# Patient Record
Sex: Female | Born: 1950 | Race: White | Hispanic: No | Smoking: Former smoker
Health system: Southern US, Community
[De-identification: ages and names within clinical notes are randomized; demographics above are authoritative.]

## PROBLEM LIST (undated history)

## (undated) DIAGNOSIS — F419 Anxiety disorder, unspecified: Secondary | ICD-10-CM

## (undated) DIAGNOSIS — F32A Depression, unspecified: Secondary | ICD-10-CM

## (undated) DIAGNOSIS — F329 Major depressive disorder, single episode, unspecified: Secondary | ICD-10-CM

## (undated) DIAGNOSIS — J449 Chronic obstructive pulmonary disease, unspecified: Secondary | ICD-10-CM

## (undated) HISTORY — PX: APPENDECTOMY: SHX54

## (undated) HISTORY — PX: STOMACH SURGERY: SHX791

---

## 1998-09-20 ENCOUNTER — Encounter: Payer: Self-pay | Admitting: Family Medicine

## 1998-09-20 ENCOUNTER — Ambulatory Visit (HOSPITAL_COMMUNITY): Admission: RE | Admit: 1998-09-20 | Discharge: 1998-09-20 | Payer: Self-pay | Admitting: Family Medicine

## 1999-10-24 ENCOUNTER — Ambulatory Visit (HOSPITAL_COMMUNITY): Admission: RE | Admit: 1999-10-24 | Discharge: 1999-10-24 | Payer: Self-pay | Admitting: Family Medicine

## 1999-10-24 ENCOUNTER — Encounter: Payer: Self-pay | Admitting: Family Medicine

## 1999-11-15 ENCOUNTER — Emergency Department (HOSPITAL_COMMUNITY): Admission: EM | Admit: 1999-11-15 | Discharge: 1999-11-15 | Payer: Self-pay | Admitting: Emergency Medicine

## 1999-11-15 ENCOUNTER — Encounter: Payer: Self-pay | Admitting: Emergency Medicine

## 2000-12-03 ENCOUNTER — Encounter: Payer: Self-pay | Admitting: Family Medicine

## 2000-12-03 ENCOUNTER — Ambulatory Visit (HOSPITAL_COMMUNITY): Admission: RE | Admit: 2000-12-03 | Discharge: 2000-12-03 | Payer: Self-pay | Admitting: Family Medicine

## 2001-02-08 ENCOUNTER — Observation Stay (HOSPITAL_COMMUNITY): Admission: RE | Admit: 2001-02-08 | Discharge: 2001-02-09 | Payer: Self-pay | Admitting: Obstetrics and Gynecology

## 2001-12-23 ENCOUNTER — Encounter: Payer: Self-pay | Admitting: Family Medicine

## 2001-12-23 ENCOUNTER — Ambulatory Visit (HOSPITAL_COMMUNITY): Admission: RE | Admit: 2001-12-23 | Discharge: 2001-12-23 | Payer: Self-pay | Admitting: Family Medicine

## 2002-02-14 ENCOUNTER — Other Ambulatory Visit: Admission: RE | Admit: 2002-02-14 | Discharge: 2002-02-14 | Payer: Self-pay | Admitting: Obstetrics and Gynecology

## 2002-02-28 ENCOUNTER — Encounter: Payer: Self-pay | Admitting: Obstetrics and Gynecology

## 2002-02-28 ENCOUNTER — Encounter: Admission: RE | Admit: 2002-02-28 | Discharge: 2002-02-28 | Payer: Self-pay | Admitting: Obstetrics and Gynecology

## 2002-11-03 ENCOUNTER — Inpatient Hospital Stay (HOSPITAL_COMMUNITY): Admission: EM | Admit: 2002-11-03 | Discharge: 2002-11-12 | Payer: Self-pay | Admitting: Psychiatry

## 2009-07-19 ENCOUNTER — Emergency Department: Payer: Self-pay | Admitting: Emergency Medicine

## 2009-12-07 ENCOUNTER — Emergency Department: Payer: Self-pay | Admitting: Emergency Medicine

## 2011-01-06 ENCOUNTER — Emergency Department (HOSPITAL_COMMUNITY)
Admission: EM | Admit: 2011-01-06 | Discharge: 2011-01-06 | Disposition: A | Discharge status: Home / Self Care | Payer: Medicare Other | Attending: Emergency Medicine | Admitting: Emergency Medicine

## 2011-01-06 DIAGNOSIS — J449 Chronic obstructive pulmonary disease, unspecified: Secondary | ICD-10-CM | POA: Insufficient documentation

## 2011-01-06 DIAGNOSIS — F3289 Other specified depressive episodes: Secondary | ICD-10-CM | POA: Insufficient documentation

## 2011-01-06 DIAGNOSIS — F1010 Alcohol abuse, uncomplicated: Secondary | ICD-10-CM | POA: Insufficient documentation

## 2011-01-06 DIAGNOSIS — F329 Major depressive disorder, single episode, unspecified: Secondary | ICD-10-CM | POA: Insufficient documentation

## 2011-01-06 DIAGNOSIS — F068 Other specified mental disorders due to known physiological condition: Secondary | ICD-10-CM | POA: Insufficient documentation

## 2011-01-06 DIAGNOSIS — J4489 Other specified chronic obstructive pulmonary disease: Secondary | ICD-10-CM | POA: Insufficient documentation

## 2011-01-06 LAB — CBC
HCT: 40.7 % (ref 36.0–46.0)
Hemoglobin: 13.4 g/dL (ref 12.0–15.0)
MCH: 30.1 pg (ref 26.0–34.0)
MCHC: 32.9 g/dL (ref 30.0–36.0)
MCV: 91.5 fL (ref 78.0–100.0)
Platelets: 241 10*3/uL (ref 150–400)
RBC: 4.45 MIL/uL (ref 3.87–5.11)
RDW: 14.4 % (ref 11.5–15.5)
WBC: 7.1 10*3/uL (ref 4.0–10.5)

## 2011-01-06 LAB — DIFFERENTIAL
Basophils Absolute: 0 10*3/uL (ref 0.0–0.1)
Basophils Relative: 1 % (ref 0–1)
Eosinophils Absolute: 0.1 10*3/uL (ref 0.0–0.7)
Eosinophils Relative: 1 % (ref 0–5)
Lymphocytes Relative: 35 % (ref 12–46)
Lymphs Abs: 2.5 10*3/uL (ref 0.7–4.0)
Monocytes Absolute: 0.7 10*3/uL (ref 0.1–1.0)
Monocytes Relative: 10 % (ref 3–12)
Neutro Abs: 3.8 10*3/uL (ref 1.7–7.7)
Neutrophils Relative %: 54 % (ref 43–77)

## 2011-01-06 LAB — COMPREHENSIVE METABOLIC PANEL
AST: 24 U/L (ref 0–37)
BUN: 7 mg/dL (ref 6–23)
CO2: 27 mEq/L (ref 19–32)
Chloride: 104 mEq/L (ref 96–112)
Creatinine, Ser: 0.74 mg/dL (ref 0.50–1.10)
GFR calc non Af Amer: >60 mL/min (ref >60)
Total Bilirubin: 0.3 mg/dL (ref 0.3–1.2)

## 2011-01-06 LAB — RAPID URINE DRUG SCREEN, HOSP PERFORMED
Cocaine: NOT DETECTED
Opiates: NOT DETECTED

## 2011-01-06 LAB — ETHANOL: Alcohol, Ethyl (B): 93 mg/dL — ABNORMAL HIGH (ref 0–11)

## 2014-01-04 ENCOUNTER — Observation Stay (HOSPITAL_COMMUNITY)
Admission: AD | Admit: 2014-01-04 | Discharge: 2014-01-05 | Disposition: A | Discharge status: Home / Self Care | Payer: Medicare Other | Source: Intra-hospital | Attending: Psychiatry | Admitting: Psychiatry

## 2014-01-04 ENCOUNTER — Encounter (HOSPITAL_COMMUNITY): Payer: Self-pay | Admitting: Emergency Medicine

## 2014-01-04 ENCOUNTER — Emergency Department (HOSPITAL_COMMUNITY)
Admission: EM | Admit: 2014-01-04 | Discharge: 2014-01-04 | Disposition: A | Discharge status: Other Acute Inpatient Hospital | Payer: Medicare Other | Source: Home / Self Care

## 2014-01-04 DIAGNOSIS — F411 Generalized anxiety disorder: Secondary | ICD-10-CM | POA: Insufficient documentation

## 2014-01-04 DIAGNOSIS — F17200 Nicotine dependence, unspecified, uncomplicated: Secondary | ICD-10-CM | POA: Insufficient documentation

## 2014-01-04 DIAGNOSIS — F39 Unspecified mood [affective] disorder: Secondary | ICD-10-CM

## 2014-01-04 DIAGNOSIS — F329 Major depressive disorder, single episode, unspecified: Secondary | ICD-10-CM | POA: Diagnosis present

## 2014-01-04 DIAGNOSIS — F332 Major depressive disorder, recurrent severe without psychotic features: Principal | ICD-10-CM | POA: Insufficient documentation

## 2014-01-04 DIAGNOSIS — F32A Depression, unspecified: Secondary | ICD-10-CM | POA: Diagnosis present

## 2014-01-04 DIAGNOSIS — F3289 Other specified depressive episodes: Secondary | ICD-10-CM | POA: Insufficient documentation

## 2014-01-04 DIAGNOSIS — R45851 Suicidal ideations: Secondary | ICD-10-CM | POA: Insufficient documentation

## 2014-01-04 HISTORY — DX: Major depressive disorder, single episode, unspecified: F32.9

## 2014-01-04 HISTORY — DX: Anxiety disorder, unspecified: F41.9

## 2014-01-04 HISTORY — DX: Depression, unspecified: F32.A

## 2014-01-04 LAB — RAPID URINE DRUG SCREEN, HOSP PERFORMED
AMPHETAMINES: NOT DETECTED
BARBITURATES: NOT DETECTED
Benzodiazepines: NOT DETECTED
Cocaine: NOT DETECTED
Opiates: NOT DETECTED
TETRAHYDROCANNABINOL: NOT DETECTED

## 2014-01-04 LAB — CBC
HCT: 42.1 % (ref 36.0–46.0)
HEMOGLOBIN: 14.3 g/dL (ref 12.0–15.0)
MCH: 30.6 pg (ref 26.0–34.0)
MCHC: 34 g/dL (ref 30.0–36.0)
MCV: 90.1 fL (ref 78.0–100.0)
PLATELETS: 232 10*3/uL (ref 150–400)
RBC: 4.67 MIL/uL (ref 3.87–5.11)
RDW: 13.9 % (ref 11.5–15.5)
WBC: 6.2 10*3/uL (ref 4.0–10.5)

## 2014-01-04 LAB — ETHANOL: Alcohol, Ethyl (B): <11 mg/dL (ref 0–11)

## 2014-01-04 LAB — COMPREHENSIVE METABOLIC PANEL
ALT: 12 U/L (ref 0–35)
AST: 22 U/L (ref 0–37)
Albumin: 3.7 g/dL (ref 3.5–5.2)
Alkaline Phosphatase: 99 U/L (ref 39–117)
BILIRUBIN TOTAL: 0.3 mg/dL (ref 0.3–1.2)
BUN: 12 mg/dL (ref 6–23)
CHLORIDE: 103 meq/L (ref 96–112)
CO2: 30 meq/L (ref 19–32)
Calcium: 9.5 mg/dL (ref 8.4–10.5)
Creatinine, Ser: 0.93 mg/dL (ref 0.50–1.10)
GFR, EST AFRICAN AMERICAN: 76 mL/min — AB (ref >90)
GFR, EST NON AFRICAN AMERICAN: 65 mL/min — AB (ref >90)
GLUCOSE: 97 mg/dL (ref 70–99)
Potassium: 5.3 mEq/L (ref 3.7–5.3)
SODIUM: 142 meq/L (ref 137–147)
Total Protein: 7.4 g/dL (ref 6.0–8.3)

## 2014-01-04 LAB — ACETAMINOPHEN LEVEL: Acetaminophen (Tylenol), Serum: <15 ug/mL (ref 10–30)

## 2014-01-04 LAB — SALICYLATE LEVEL: Salicylate Lvl: <2 mg/dL — ABNORMAL LOW (ref 2.8–20.0)

## 2014-01-04 MED ORDER — MAGNESIUM HYDROXIDE 400 MG/5ML PO SUSP: 30.0000 mL | Freq: Every day | ORAL | Status: DC | PRN

## 2014-01-04 MED ORDER — ACETAMINOPHEN 325 MG PO TABS: 650.0000 mg | ORAL_TABLET | Freq: Four times a day (QID) | ORAL | Status: DC | PRN

## 2014-01-04 MED ORDER — CLONAZEPAM 0.5 MG PO TABS
0.5000 mg | ORAL_TABLET | Freq: Three times a day (TID) | ORAL | Status: DC | PRN
  Administered 2014-01-04: 0.5 mg via ORAL
  Filled 2014-01-04: qty 1

## 2014-01-04 MED ORDER — CITALOPRAM HYDROBROMIDE 20 MG PO TABS
20.0000 mg | ORAL_TABLET | Freq: Every day | ORAL | Status: DC
  Administered 2014-01-05: 20 mg via ORAL
  Filled 2014-01-04 (×3): qty 1

## 2014-01-04 MED ORDER — ALUM & MAG HYDROXIDE-SIMETH 200-200-20 MG/5ML PO SUSP: 30.0000 mL | ORAL | Status: DC | PRN

## 2014-01-04 MED ORDER — CITALOPRAM HYDROBROMIDE 20 MG PO TABS
20.0000 mg | ORAL_TABLET | Freq: Every day | ORAL | Status: DC
  Administered 2014-01-04: 20 mg via ORAL
  Filled 2014-01-04: qty 1

## 2014-01-04 NOTE — Progress Notes (Deleted)
[facility] INPATIENT:  Family/Significant Other Suicide Prevention Education  Suicide Prevention Education:  Patient Refusal for Family/Significant Other Suicide Prevention Education: The patient Gwendolyn Steele has refused to provide written consent for family/significant other to be provided Family/Significant Other Suicide Prevention Education during admission and/or prior to discharge.  Physician notified.  Gwendolyn Steele, Zaineb Nowaczyk [facility] 01/04/2014, 10:11 PM

## 2014-01-04 NOTE — Consult Note (Signed)
  Patient states that she has a history of depression and a previous history of suicide attempt.  Patient states that 4 days ago she was having suicidal thought; denies at this time.  Patient had no outpatient psych provider.    Agree with the TTS assessment' Recommend Observation overnight and setting patient up with outpatient services prior to discharge.  [facility].  Monitor safety and stabilization until transferred.  Home medication started.   Shuvon B. Rankin FNP-BC  Reviewed the information documented and agree with the treatment plan.  Lanee Chain,JANARDHAHA R. 01/05/2014 4:18 PM

## 2014-01-04 NOTE — BH Assessment (Signed)
Tele Assessment Note   Gwendolyn Steele is an 63 y.o. female that was assessed by CSW on this date via face to face after [facility]  Pt states that she's been depressed for some time now and having suicidal ideation last Wednesday triggered her to come to the ED for help.  Pt explained that [place] is retired and from [place] comes to stay with family in [place], [place] for 4 months of the year, every year.  Pt plans to return to [place] in September unless she receives better mental health services here.  Pt states that resources are limited in [place] so she's been seeing [place]CP in [place] for the last 7 years for medication management and has been prescribed celexa and klonopin for the last 7 years by PCP.  Pt states that she feels these meds no longer work for her as she has been depressed and anxious lately.  Pt describes her symptoms as "living in a black hole".  Pt states that she feels worthless, hopeless and suicidal, with last thoughts being last Wednesday.  Pt states that she had a thought to run her car into a tree, which scared her and made her come and get help.  Pt states that she isolates her room and her family leaves her alone.  Pt reports increased sleep, with maybe only being awake 10 hours over the last week and sleeping 22 hours on average a day. Pt reports impulsivity at times as well.  Pt denies SI, HI, AVH currently and is requesting a referral for immediate medication management and therapy.   Pt reports a suicide attempt 7 years ago by overdose on tylenol and was expected not to live.  Pt reports being inpatient after this incident and also 10 [facility], for [facility].  Pt denies any consistent outpatient treatment due to limited resources in her home town.  Pt denies any current triggers or stressors.  Pt reports a history of cutting 3 years ago.  Pt states that her family is supportive.  Pt states that she feels safe and can contract for safety at this  time, but only if she has immediate help for medications and therapy.  CSW discussed the observation unit and IOP as options with pt.  Pt states that she is okay with either.  CSW ran pt by Assunta FoundShuvon Rankin, NP who agreed with this recommendation as well as overnight observation and reassess in the morning.     Axis I: Mood Disorder NOS Axis II: Deferred Axis III:  Past Medical History  Diagnosis Date  . Depression   . Anxiety    Axis IV: other psychosocial or environmental problems and problems with access to health care services Axis V: 51-60 moderate symptoms  Past Medical History:  Past Medical History  Diagnosis Date  . Depression   . Anxiety     Past Surgical History  Procedure Laterality Date  . Appendectomy    . Stomach surgery      tummy tuck  . Stomach surgery      Family History: History reviewed. No pertinent family history.  Social History:  reports that she has been smoking Cigarettes.  She has a 45 pack-year smoking history. She does not have any smokeless tobacco history on file. She reports that she does not drink alcohol or use illicit drugs.  Additional Social History:  Alcohol / Drug Use Pain Medications: See MAR Prescriptions: See MAR [facility] the [facility]  History of alcohol / drug use?: No history of alcohol / drug abuse Longest period of sobriety (when/how long): N/A  CIWA: CIWA-Ar BP: 132/85 mmHg Pulse Rate: 79 COWS:    Allergies:  Allergies  Allergen Reactions  . Abilify [Aripiprazole] Other (See Comments)    Increased suicidal ideations "made her feel worse"    Home Medications:  (Not in a hospital admission)  OB/GYN Status:  No LMP recorded.  General Assessment Data [facility] of Assessment: [facility] ACT Assessment: Yes Is this a Tele or Face-to-Face Assessment?: Face-to-Face Is this an Initial Assessment or a Re-assessment for this encounter?: Initial Assessment Living Arrangements: Other relatives Can pt return to current living  arrangement?: Yes Admission Status: Voluntary Is patient capable of signing voluntary admission?: Yes Transfer from: Home Referral Source: Self/Family/Friend  Medical Screening Exam [facility] Walk-in ONLY) Medical Exam completed: No (N/A) Reason for MSE not completed: Other: (N/A)  [facility] Crisis Care Plan Living Arrangements: Other relatives Name of Psychiatrist:  (None) Name of Therapist:  (None)  Education Status Is patient currently in school?: No Current Grade:  (N/A) Highest grade of school patient has completed:  (11th grade, received GED) Name of school:  (N/A) Contact person:  (N/A)  Risk to self Suicidal Ideation: No-Not Currently/Within Last 6 Months Suicidal Intent: No-Not Currently/Within Last 6 Months Is patient at risk for suicide?: No Suicidal Plan?: Yes-Currently Present Specify Current Suicidal Plan:  (run car into a tree) Access to Means: Yes Specify Access to Suicidal Means:  (has own car) What has been your use of drugs/alcohol within the last 12 months?:  (None reported) Previous Attempts/Gestures: Yes (overdosed 7 years ago) How many times?:  (1) Other Self Harm Risks:  (unknown) Triggers for Past Attempts: Unpredictable;Unknown Intentional Self Injurious Behavior: Cutting (cut 3 years ago) Family Suicide History: Unknown Recent stressful life event(s): Other (Comment) (None) Persecutory voices/beliefs?: No Depression: Yes Depression Symptoms: Tearfulness;Isolating;Fatigue;Guilt;Loss of interest in usual pleasures;Feeling worthless/self pity Substance abuse history and/or treatment for substance abuse?: No Suicide prevention information given to non-admitted patients: Not applicable  Risk to Others Homicidal Ideation: No Thoughts of Harm to Others: No Current Homicidal Intent: No Current Homicidal Plan: No Access to Homicidal Means: No Identified Victim:  (N/A) History of harm to others?: No Assessment of Violence: None Noted Violent Behavior  Description:  (None noted) Does patient have access to weapons?: No Criminal Charges Pending?: No Does patient have a court date: No  Psychosis Hallucinations: None noted Delusions: None noted  Mental Status Report Appear/Hygiene: Unremarkable Eye Contact: Good Motor Activity: Unremarkable Speech: Logical/coherent;Pressured Level of Consciousness: Alert Mood: Worthless, low self-esteem;Helpless Affect: Inconsistent with thought content Anxiety Level: Minimal Thought Processes: Coherent;Relevant Judgement: Unimpaired Orientation: Place;Person;Time;Situation;Appropriate for developmental age Obsessive Compulsive Thoughts/Behaviors: None  Cognitive Functioning Concentration: Normal Memory: Recent Intact;Remote Intact IQ: Average Insight: Good Impulse Control: Fair Appetite: Fair Weight Loss:  (none reported) Weight Gain:  (none reported) Sleep: Increased Total Hours of Sleep:  (22 hours yesterday) Vegetative Symptoms: Staying in bed  ADLScreening [facility] Assessment Services) Patient's cognitive ability adequate to safely complete daily activities?: Yes Patient able to express need for assistance with ADLs?: Yes Independently performs ADLs?: Yes (appropriate for developmental age)  Prior Inpatient Therapy Prior Inpatient Therapy: Yes Prior Therapy Dates:  (10 and 7 years ago) Prior Therapy Facilty/Provider(s):  [facility], inpt in [place] 7 years ago) Reason for Treatment:  (depression, SI, suicide attempt)  Prior Outpatient Therapy Prior Outpatient Therapy: No Prior Therapy Dates:  (N/A) Prior Therapy Facilty/Provider(s):  (  N/A) Reason for Treatment:  (N/A)  ADL Screening (condition at time of admission) Patient's cognitive ability adequate to safely complete daily activities?: Yes Is the patient deaf or have difficulty hearing?: No Does the patient have difficulty seeing, even when wearing glasses/contacts?: No Does the patient have difficulty concentrating,  remembering, or making decisions?: Yes Patient able to express need for assistance with ADLs?: Yes Independently performs ADLs?: Yes (appropriate for developmental age) Does the patient have difficulty walking or climbing stairs?: No Weakness of Legs: None Weakness of Arms/Hands: None  Home Assistive Devices/Equipment Home Assistive Devices/Equipment: None  Therapy Consults (therapy consults require a physician order) PT Evaluation Needed: No OT Evalulation Needed: No SLP Evaluation Needed: No Abuse/Neglect Assessment (Assessment to be complete while patient is alone) Physical Abuse: Denies Verbal Abuse: Denies Sexual Abuse: Denies Exploitation of patient/patient's resources: Denies Self-Neglect: Denies Values / Beliefs Cultural Requests During Hospitalization: None Spiritual Requests During Hospitalization: None Consults Spiritual Care Consult Needed: No Social Work Consult Needed: No Merchant navy officerAdvance Directives (For Healthcare) Advance Directive: Patient does not have advance directive Pre-existing out of facility DNR order (yellow form or pink MOST form): No Nutrition Screen- [facility] Adult/[facility] Patient's home diet: Regular  Additional Information 1:1 In Past 12 Months?: No CIRT Risk: No Elopement Risk: No Does patient have medical clearance?: Yes  Child/Adolescent Assessment Running Away Risk: Denies Bed-Wetting: Denies Destruction of Property: Denies Cruelty to Animals: Denies Stealing: Denies Rebellious/Defies Authority: Denies Satanic Involvement: Denies Archivistire Setting: Denies Problems at [employer]: Denies Gang Involvement: Denies  Disposition:  Disposition Initial Assessment Completed for this Encounter: Yes Disposition of Patient: Other dispositions Other disposition(s): Other (Comment) (either observation unit or IOP)  Horton, Salome ArntChelsea Nicole 01/04/2014 2:51 PM

## 2014-01-04 NOTE — Plan of Care (Deleted)
[facility] Observation Crisis Plan  Reason for Crisis Plan:  Crisis Stabilization   Plan of Care:  Referral for Inpatient Hospitalization  Family Support:    Gwendolyn SjogrenDavid Steele [phone]  Current Living Environment:   [place]ent in the woods.   Insurance:   Hospital Account   Name Acct ID Class Status Primary Coverage   Gwendolyn Steele, Gwendolyn Steele [id number] BEHAVIORAL HEALTH OBSERVATION Open MEDICARE - MEDICARE PART A AND B        Guarantor Account (for Hospital Account [id number])   Name Relation to Pt Service Area Active? Acct Type   Gwendolyn Steele, G[facility]olyn Steele [facility] Yes Behavioral Health   Address Phone       [address] [place], [place] [phone])          Coverage Information (for Hospital Account [id number])   F/O Payor/Plan Precert #   MEDICARE/MEDICARE PART A AND B    Subscriber Subscriber #   Gwendolyn Steele, Gwendolyn Steele [id number] A   Address Phone   [address] [place], [place] [phone]       Legal Guardian:   Self  Primary Care Provider:  No primary provider on file.  Current Outpatient Providers:  ACT  Psychiatrist:   Dr Odelia Steele  Counselor/Therapist:   Josie DixonNorma Steele   Compliant with Medications:  Yes  Additional Information:   Gwendolyn Steele, Gwendolyn Steele 6/14/201510:08 PM

## 2014-01-04 NOTE — ED Provider Notes (Signed)
CSN: [id number]     Arrival date & time 01/04/14  1222 History  This chart was scribed for non-physician practitioner, Teressa LowerVrinda Nyjae Hodge, NP working with Lyanne CoKevin M Campos, MD by Greggory StallionKayla Andersen, ED scribe. This patient was seen in room [facility]TR3/[facility] and the patient's care was started at 12:51 PM.   Chief Complaint  Patient presents with  . Medical Clearance   The history is provided by the patient. No language interpreter was used.   HPI Comments: Gwendolyn Steele is a 63 y.o. female with a history of anxiety and depression who presents to the Emergency Department for medical clearance. She states her anxiety and depression have increased over the past year. States she sleeps constantly because of the depression. Pt does not have a therapist or psychiatrist and only sees her PCP. She is currently taking clonazepam and citalopram daily. Pt tried to commit suicide 7 years ago by taking several Tylenol PM pills. She had suicidal ideations 4 days ago and states she thought about running her car into a tree but denies current SI. Denies HI. Pt has history of alcohol abuse for about 6 months two years ago. Denies history of drug abuse.   Past Medical History  Diagnosis Date  . Depression    Past Surgical History  Procedure Laterality Date  . Appendectomy    . Stomach surgery      tummy tuck  . Stomach surgery     History reviewed. No pertinent family history. History  Substance Use Topics  . Smoking status: Current Every Day Smoker -- 1.00 packs/day for 45 years    Types: Cigarettes  . Smokeless tobacco: Not on file  . Alcohol Use: No     Comment: sober 2 years   OB History   Grav Para Term Preterm Abortions TAB SAB Ect Mult Living                 Review of Systems  Psychiatric/Behavioral: Positive for dysphoric mood. Negative for suicidal ideas. The patient is nervous/anxious.   All other systems reviewed and are negative.  Allergies  Abilify  Home Medications   Prior to Admission  medications   Not on File   BP 132/85  Pulse 79  Temp(Src) 98.4 F (36.9 C) (Oral)  Resp 16  Wt 193 lb (87.544 kg)  SpO2 96%  Physical Exam  Nursing note and vitals reviewed. Constitutional: She is oriented to person, place, and time. She appears well-developed and well-nourished. No distress.  HENT:  Head: Normocephalic and atraumatic.  Eyes: Conjunctivae and EOM are normal.  Neck: Normal range of motion. No tracheal deviation present.  Cardiovascular: Normal rate, regular rhythm and normal heart sounds.   Pulmonary/Chest: Effort normal and breath sounds normal. No respiratory distress. She has no wheezes. She has no rales.  Musculoskeletal: Normal range of motion.  Neurological: She is alert and oriented to person, place, and time.  Skin: Skin is warm and dry.  Psychiatric: She has a normal mood and affect. Her behavior is normal. She expresses no homicidal and no suicidal ideation. She expresses no suicidal plans and no homicidal plans.  Previous suicide attempt with overdose    ED Course  Procedures (including critical care time)  DIAGNOSTIC STUDIES: Oxygen Saturation is 96% on RA, normal by my interpretation.    COORDINATION OF CARE: 12:58 PM-Discussed treatment plan which includes lab work and speaking with a psychiatrist with pt at bedside and pt agreed to plan.   Labs Review Labs Reviewed  COMPREHENSIVE METABOLIC PANEL - Abnormal; Notable for the following:    GFR calc non Af Amer 65 (*)    GFR calc Af Amer 76 (*)    All other components within normal limits  SALICYLATE LEVEL - Abnormal; Notable for the following:    Salicylate Lvl <2.0 (*)    All other components within normal limits  ACETAMINOPHEN LEVEL  CBC  ETHANOL  URINE RAPID DRUG SCREEN (HOSP PERFORMED)    Imaging Review No results found.   EKG Interpretation None      MDM   Final diagnoses:  Depressive disorder    Pt to be evaluated by tts and possibly placed. Pt is not actively  suicidal  I personally performed the services described in this documentation, which was scribed in my presence. The recorded information has been reviewed and is accurate.  Teressa LowerVrinda Mandy Peeks, NP 01/04/14 2008

## 2014-01-04 NOTE — ED Notes (Signed)
Pt reports hx of depression, possible bipolar. Has not seen a psychiatrist in 8 years. Goes to her pcp for mediations. Pt reports she wants to "not feel like she is living in a black hole", at present denies SI. Reports on Thursday pt has SI thoughts of running her car into a tree. Pt denies HI, AH/VH.  Pt denies pain.

## 2014-01-04 NOTE — Progress Notes (Signed)
[facility] INPATIENT:  Family/Significant Other Suicide Prevention Education  Suicide Prevention Education:  Patient Refusal for Family/Significant Other Suicide Prevention Education: The patient Gwendolyn Steele has refused to provide written consent for family/significant other to be provided Family/Significant Other Suicide Prevention Education during admission and/or prior to discharge.  Physician notified.  Glenice LaineIbekwe, Garvis Downum B 01/04/2014, 11:36 PM

## 2014-01-04 NOTE — ED Notes (Signed)
She is very pleasant and cooperative.  She tells me "I am voluntary and I am haing issues with depression."  She was seen by the N.P. Earlier, who told me that she will have a slot in the "holding area" of [facility]. At 1930 hours.

## 2014-01-04 NOTE — Plan of Care (Signed)
[facility] Observation Crisis Plan  Reason for Crisis Plan:  Crisis Stabilization   Plan of Care:  Referral for Telepsychiatry/Psychiatric Consult  Family Support:    SISTER [phone]  Current Living Environment:  PATIENT'S HOME   Insurance:   Hospital Account   Name Acct ID Class Status Primary Coverage   Gwendolyn Steele, Gwendolyn Steele [id number] BEHAVIORAL HEALTH OBSERVATION Open MEDICARE - MEDICARE PART A AND Steele        Guarantor Account (for Hospital Account [id number])   Name Relation to Pt Service Area Active? Acct Type   Gwendolyn Steele, Gwendolyn Steele Self [facility] Yes Behavioral Health   Address Phone       [address] [place], [place] [phone](H)          Coverage Information (for Hospital Account [id number])   F/O Payor/Plan Precert #   MEDICARE/MEDICARE PART A AND Steele    Subscriber Subscriber #   Gwendolyn Steele, Gwendolyn Steele [id number] A   Address Phone   [address] [place], [place] [phone]       Legal Guardian:   NONE  Primary Care Provider:  PT DID NOT PROVIDE NAME  Current Outpatient Providers:  NONE    Psychiatrist:   NONE  Counselor/Therapist:   NONE  Compliant with Medications:  Yes  Additional Information:   Gwendolyn Steele, Gwendolyn Steele 6/14/201511:39 PM

## 2014-01-05 ENCOUNTER — Encounter (HOSPITAL_COMMUNITY): Payer: Self-pay | Admitting: *Deleted

## 2014-01-05 DIAGNOSIS — F332 Major depressive disorder, recurrent severe without psychotic features: Secondary | ICD-10-CM

## 2014-01-05 MED ORDER — CITALOPRAM HYDROBROMIDE 20 MG PO TABS: 20.0000 mg | ORAL_TABLET | Freq: Every day | ORAL | Status: AC

## 2014-01-05 MED ORDER — CLONAZEPAM 1 MG PO TABS: 1.0000 mg | ORAL_TABLET | Freq: Three times a day (TID) | ORAL | Status: DC

## 2014-01-05 NOTE — Discharge Instructions (Signed)
For your current behavioral health needs you have a scheduled start date for the Mental Health Intensive Outpatient Program (MH-IOP) at [facility] in [place].  The program meets Monday - Friday from 9:00 am - 12:00 pm.  Your start date is Tuesday, 01/13/2014.  On the first day, plan to be at the Clinic at 8:45 am.  Lenox AhrFill out the intake paperwork provided by the Observation Unit staff, and take it with you to the first session.       Mental Health Intensive Outpatient Program (MH-IOP)      [facility]      [address]      [place], [place] [address]      Contact person: Jeri ModenaRita Clark      [phone]

## 2014-01-05 NOTE — Progress Notes (Signed)
D  Patient reports no medication changes in years. Increased anxiety. Patient reports taking Klonopin for years. Patient requesting medication changes and outpatient treatment. A  Offered support, encouragement and 15 minute checks.  Medications given as ordered. R  Pt denies SI HI.  Safety maintained on observation unit.

## 2014-01-05 NOTE — H&P (Signed)
[place] OBS H&P  Patient Identification:  Gwendolyn Steele Date of Evaluation:  01/05/2014 Chief Complaint:  MOOD DISORDER NOS  Subjective: Pt seen and chart reviewed. Pt denies SI, HI, and AVH, contracts for safety. Pt reports SI that has resolved 48 hours ago. At this point in time, pt desires intensive outpatient therapy and is a candidate for IOP at [facility]. TTS staff to coordinate this option and refer if this is unavailable.   HPI:  Gwendolyn Steele is an 63 y.o. female that was assessed by CSW on this date via face to face after presenting to [facility] voluntarily. Pt states that she's been depressed for some time now and having suicidal ideation last Wednesday triggered her to come to the ED for help. Pt explained that she is retired and from [place] but comes to stay with family in [place], [place] for 4 months of the year, every year. [place] to [place] in [place] unless she receives better mental health services here. Pt states that resources are limited in [place] so she's been seeing [place] PCP in [place] for the last 7 years for medication management and has been prescribed celexa and klonopin for the last 7 years by PCP. Pt states that she feels these meds no longer work for her as she has been depressed and anxious lately. Pt describes her symptoms as "living in a black hole". Pt states that she feels worthless, hopeless and suicidal, with last thoughts being last Wednesday. Pt states that she had a thought to run her car into a tree, which scared her and made her come and get help. Pt states that she isolates her room and her family leaves her alone. Pt reports increased sleep, with maybe only being awake 10 hours over the last week and sleeping 22 hours on average a day. Pt reports impulsivity at times as well. Pt denies SI, HI, AVH currently and is requesting a referral for immediate medication management and therapy.  Pt reports a suicide attempt 7 years ago by overdose on tylenol and was expected not to  live. Pt reports being inpatient after this incident and also 10 years ago at [facility], for depression. Pt denies any consistent outpatient treatment due to limited resources in her home town. Pt denies any current triggers or stressors. Pt reports a history of cutting 3 years ago. Pt states that her family is supportive. Pt states that she feels safe and can contract for safety at this time, but only if she has immediate help for medications and therapy. CSW discussed the observation unit and IOP as options with pt. Pt states that she is okay with either. CSW ran pt by Earleen Newport, NP who agreed with this recommendation as well as overnight observation and reassess in the morning.    Total Time spent with patient: 40 minutes (initial)   Psychiatric Specialty Exam: Physical Exam Full Physical Exam performed in ED; reviewed, stable, and I concur with this assessment.   Review of Systems  Constitutional: Negative.   HENT: Negative.   Eyes: Negative.   Respiratory: Negative.   Cardiovascular: Negative.   Gastrointestinal: Negative.   Genitourinary: Negative.   Musculoskeletal: Negative.   Skin: Negative.   Neurological: Negative.   Endo/Heme/Allergies: Negative.   Psychiatric/Behavioral: Positive for depression. The patient is nervous/anxious and has insomnia.     Blood pressure 134/91, pulse 89, temperature 97.6 F (36.4 C), temperature source Oral, resp. rate 16, height $RemoveBe'5\' 5"'WBtBBFhRl$  (1.651 m), weight 87.998 kg (  194 lb).Body mass index is 32.28 kg/(m^2).  General Appearance: Casual  Eye Contact::  Good  Speech:  Clear and Coherent  Volume:  Normal  Mood:  Anxious and Depressed  Affect:  Appropriate and Depressed  Thought Process:  Coherent and Goal Directed  Orientation:  Full (Time, Place, and Person)  Thought Content:  WDL  Suicidal Thoughts:  No  Homicidal Thoughts:  No  Memory:  Immediate;   Fair Recent;   Fair Remote;   Fair  Judgement:  Fair  Insight:  Fair  Psychomotor  Activity:  Normal  Concentration:  Fair  Recall:  [employer] of Knowledge:Fair  Language: NA  Akathisia:  No  Handed:    AIMS (if indicated):     Assets:  Communication Skills Desire for Improvement Resilience  Sleep:       Musculoskeletal: Strength & Muscle Tone: within normal limits Gait & Station: normal Patient leans: N/A   Past Medical History:   Past Medical History  Diagnosis Date  . Depression   . Anxiety    None. Allergies:   Allergies  Allergen Reactions  . Abilify [Aripiprazole] Other (See Comments)    Increased suicidal ideations "made her feel worse"   PTA Medications: Prescriptions prior to admission  Medication Sig Dispense Refill  . citalopram (CELEXA) 20 MG tablet Take 20 mg by mouth daily.       . clonazePAM (KLONOPIN) 1 MG tablet Take 1 mg by mouth 3 (three) times daily. She takes 3 a day, sometimes a fourth one as needed.        Previous Psychotropic Medications:    Medication/Dose  SEE MAR               Substance Abuse History in the last 12 months:  no  Consequences of Substance Abuse: NA  Social History:  reports that she has been smoking Cigarettes.  She has a 45 pack-year smoking history. She does not have any smokeless tobacco history on file. She reports that she does not drink alcohol or use illicit drugs. Additional Social History:                      Family History:  No family history on file.  Results for orders placed during the hospital encounter of 01/04/14 (from the past 72 hour(s))  URINE RAPID DRUG SCREEN (HOSP PERFORMED)     Status: None   Collection Time    01/04/14 12:53 PM      Result Value Ref Range   Opiates NONE DETECTED  NONE DETECTED   Cocaine NONE DETECTED  NONE DETECTED   Benzodiazepines NONE DETECTED  NONE DETECTED   Amphetamines NONE DETECTED  NONE DETECTED   Tetrahydrocannabinol NONE DETECTED  NONE DETECTED   Barbiturates NONE DETECTED  NONE DETECTED   Comment:            DRUG  SCREEN FOR MEDICAL PURPOSES     ONLY.  IF CONFIRMATION IS NEEDED     FOR ANY PURPOSE, NOTIFY LAB     WITHIN 5 DAYS.                LOWEST DETECTABLE LIMITS     FOR URINE DRUG SCREEN     Drug Class       Cutoff (ng/mL)     Amphetamine      1000     Barbiturate      200     Benzodiazepine   200  Tricyclics       212     Opiates          300     Cocaine          300     THC              50  ACETAMINOPHEN LEVEL     Status: None   Collection Time    01/04/14  1:04 PM      Result Value Ref Range   Acetaminophen (Tylenol), Serum <15.0  10 - 30 ug/mL   Comment:            THERAPEUTIC CONCENTRATIONS VARY     SIGNIFICANTLY. A RANGE OF 10-30     ug/mL MAY BE AN EFFECTIVE     CONCENTRATION FOR MANY PATIENTS.     HOWEVER, SOME ARE BEST TREATED     AT CONCENTRATIONS OUTSIDE THIS     RANGE.     ACETAMINOPHEN CONCENTRATIONS     >150 ug/mL AT 4 HOURS AFTER     INGESTION AND >50 ug/mL AT 12     HOURS AFTER INGESTION ARE     OFTEN ASSOCIATED WITH TOXIC     REACTIONS.  CBC     Status: None   Collection Time    01/04/14  1:04 PM      Result Value Ref Range   WBC 6.2  4.0 - 10.5 K/uL   RBC 4.67  3.87 - 5.11 MIL/uL   Hemoglobin 14.3  12.0 - 15.0 g/dL   HCT 42.1  36.0 - 46.0 %   MCV 90.1  78.0 - 100.0 fL   MCH 30.6  26.0 - 34.0 pg   MCHC 34.0  30.0 - 36.0 g/dL   RDW 13.9  11.5 - 15.5 %   Platelets 232  150 - 400 K/uL  COMPREHENSIVE METABOLIC PANEL     Status: Abnormal   Collection Time    01/04/14  1:04 PM      Result Value Ref Range   Sodium 142  137 - 147 mEq/L   Potassium 5.3  3.7 - 5.3 mEq/L   Chloride 103  96 - 112 mEq/L   CO2 30  19 - 32 mEq/L   Glucose, Bld 97  70 - 99 mg/dL   BUN 12  6 - 23 mg/dL   Creatinine, Ser 0.93  0.50 - 1.10 mg/dL   Calcium 9.5  8.4 - 10.5 mg/dL   Total Protein 7.4  6.0 - 8.3 g/dL   Albumin 3.7  3.5 - 5.2 g/dL   AST 22  0 - 37 U/L   ALT 12  0 - 35 U/L   Alkaline Phosphatase 99  39 - 117 U/L   Total Bilirubin 0.3  0.3 - 1.2 mg/dL   GFR calc  non Af Amer 65 (*) >90 mL/min   GFR calc Af Amer 76 (*) >90 mL/min   Comment: (NOTE)     The eGFR has been calculated using the CKD EPI equation.     This calculation has not been validated in all clinical situations.     eGFR's persistently <90 mL/min signify possible Chronic Kidney     Disease.  ETHANOL     Status: None   Collection Time    01/04/14  1:04 PM      Result Value Ref Range   Alcohol, Ethyl (B) <11  0 - 11 mg/dL   Comment:  LOWEST DETECTABLE LIMIT FOR     SERUM ALCOHOL IS 11 mg/dL     FOR MEDICAL PURPOSES ONLY  SALICYLATE LEVEL     Status: Abnormal   Collection Time    01/04/14  1:04 PM      Result Value Ref Range   Salicylate Lvl <7.9 (*) 2.8 - 20.0 mg/dL   Psychological Evaluations:  Assessment:   DSM5:  Depressive Disorders:  Major Depressive Disorder - Severe (296.23)  AXIS I:  Major Depression, Recurrent severe AXIS II:  Deferred AXIS III:   Past Medical History  Diagnosis Date  . Depression   . Anxiety    AXIS IV:  other psychosocial or environmental problems and problems related to social environment AXIS V:  51-60 moderate symptoms  Treatment Plan/Recommendations:   Review of chart, vital signs, medications, and notes.  1-Individual and group therapy  2-Medication management for depression and anxiety: Medications reviewed with the patient and she stated no untoward effects, unchanged. 3-Coping skills for depression, anxiety  4-Continue crisis stabilization and management  5-Address health issues--monitoring vital signs, stable  6-Treatment plan in progress to prevent relapse of depression and anxiety  Treatment Plan Summary: Daily contact with patient to assess and evaluate symptoms and progress in treatment Medication management Current Medications:  Current Facility-Administered Medications  Medication Dose Route Frequency Provider Last Rate Last Dose  . acetaminophen (TYLENOL) tablet 650 mg  650 mg Oral Q6H PRN [name],  NP      . alum & mag hydroxide-simeth (MAALOX/MYLANTA) 200-200-20 MG/5ML suspension 30 mL  30 mL Oral Q4H PRN [name], NP      . citalopram (CELEXA) tablet 20 mg  20 mg Oral Daily [name], NP   20 mg at 01/05/14 4801  . clonazePAM (KLONOPIN) tablet 0.5 mg  0.5 mg Oral TID PRN [name], NP   0.5 mg at 01/04/14 2150  . magnesium hydroxide (MILK OF MAGNESIA) suspension 30 mL  30 mL Oral Daily PRN Earleen Newport, NP        Benjamine Mola, FNP-BC 6/15/20152:18 PM

## 2014-01-05 NOTE — Progress Notes (Signed)
D: Patient admitted to the unit at 8:15 pm. Pt. Aaox3. Patient pleasant and cooperative through the admission process. Pt. Admits intermittent SI but now now.Have attempted suicide twice by overdosing on tylenol pm and cutting self some months ago. Pt. Denies pain, AH/VH. Pt. Reports constant feeling of anxiety which makes her sick in the stomach. Patient verbalized concerned about being afraid of death because of the anxiety. Pt. Stated "I don't want to die now, not soon because I want to be happy and enjoy life"  A: Skin was assessed, by female nurse and found to be clear of any abnormal marks apart from a tattoo at the left shoulder, right breast and lower right leg. Unit policies explained and understanding verbalized. Consents obtained. Snacks and fluid offered. Medication given as ordered.   R: Pt had no additional questions or concerns.

## 2014-01-05 NOTE — Discharge Summary (Signed)
[facility] OBS Discharge Summary and SRA  Patient Identification:  Gwendolyn Steele Date of Evaluation:  01/05/2014 Chief Complaint:  MOOD DISORDER NOS  Subjective: Pt seen and chart reviewed. Pt denies SI, HI, and AVH, contracts for safety. Pt reports SI that has resolved 48 hours ago. At this point in time, pt desires intensive outpatient therapy and is a candidate for IOP at [facility]. TTS staff to coordinate this option and refer if this is unavailable.   HPI:  Gwendolyn Steele is an 63 y.o. female that was assessed by CSW on this date via face to face after presenting to [facility] voluntarily. Pt states that she's been depressed for some time now and having suicidal ideation last Wednesday triggered her to come to the ED for help. Pt explained that she is retired and from [place] but comes to stay with family in [place], [place] for 4 months of the year, every year. Pt plans to return to [place] in [place] unless she receives better mental health services here. Pt states that resources are limited in [place] so she's been seeing her PCP in [place] for the last 7 years for medication management and has been prescribed celexa and klonopin for the last 7 years by PCP. Pt states that she feels these meds no longer work for her as she has been depressed and anxious lately. Pt describes her symptoms as "living in a black hole". Pt states that she feels worthless, hopeless and suicidal, with last thoughts being last Wednesday. Pt states that she had a thought to run her car into a tree, which scared her and made her come and get help. Pt states that she isolates her room and her family leaves her alone. Pt reports increased sleep, with maybe only being awake 10 hours over the last week and sleeping 22 hours on average a day. Pt reports impulsivity at times as well. Pt denies SI, HI, AVH currently and is requesting a referral for immediate medication management and therapy.  Pt reports a suicide attempt 7 years ago by overdose on tylenol and  was expected not to live. Pt reports being inpatient after this incident and also 10 years ago at [facility], for depression. Pt denies any consistent outpatient treatment due to limited resources in her home town. Pt denies any current triggers or stressors. Pt reports a history of cutting 3 years ago. Pt states that her family is supportive. Pt states that she feels safe and can contract for safety at this time, but only if she has immediate help for medications and therapy. CSW discussed the observation unit and IOP as options with pt. Pt states that she is okay with either. CSW ran pt by Earleen Newport, NP who agreed with this recommendation as well as overnight observation and reassess in the morning.    Total Time spent with patient: 40 minutes (initial)   Psychiatric Specialty Exam: Physical Exam Full Physical Exam performed in ED; reviewed, stable, and I concur with this assessment.   Review of Systems  Constitutional: Negative.   HENT: Negative.   Eyes: Negative.   Respiratory: Negative.   Cardiovascular: Negative.   Gastrointestinal: Negative.   Genitourinary: Negative.   Musculoskeletal: Negative.   Skin: Negative.   Neurological: Negative.   Endo/Heme/Allergies: Negative.   Psychiatric/Behavioral: Positive for depression. The patient is nervous/anxious.     Blood pressure 134/91, pulse 89, temperature 97.6 F (36.4 C), temperature source Oral, resp. rate 16, height $RemoveBe'5\' 5"'xGhrfSqkl$  (1.651 m), weight 87.998 kg (  194 lb).Body mass index is 32.28 kg/(m^2).  General Appearance: Casual  Eye Contact::  Good  Speech:  Clear and Coherent  Volume:  Normal  Mood:  Anxious and Depressed  Affect:  Appropriate and Depressed  Thought Process:  Coherent and Goal Directed  Orientation:  Full (Time, Place, and Person)  Thought Content:  WDL  Suicidal Thoughts:  No  Homicidal Thoughts:  No  Memory:  Immediate;   Fair Recent;   Fair Remote;   Fair  Judgement:  Fair  Insight:  Fair   Psychomotor Activity:  Normal  Concentration:  Fair  Recall:  [employer] of Knowledge:Fair  Language: NA  Akathisia:  No  Handed:    AIMS (if indicated):     Assets:  Communication Skills Desire for Improvement Resilience  Sleep:       Musculoskeletal: Strength & Muscle Tone: within normal limits Gait & Station: normal Patient leans: N/A   Past Medical History:   Past Medical History  Diagnosis Date  . Depression   . Anxiety    None. Allergies:   Allergies  Allergen Reactions  . Abilify [Aripiprazole] Other (See Comments)    Increased suicidal ideations "made her feel worse"   PTA Medications: Prescriptions prior to admission  Medication Sig Dispense Refill  . citalopram (CELEXA) 20 MG tablet Take 20 mg by mouth daily.       . clonazePAM (KLONOPIN) 1 MG tablet Take 1 mg by mouth 3 (three) times daily. She takes 3 a day, sometimes a fourth one as needed.        Previous Psychotropic Medications:    Medication/Dose  SEE MAR               Substance Abuse History in the last 12 months:  no  Consequences of Substance Abuse: NA  Social History:  reports that she has been smoking Cigarettes.  She has a 45 pack-year smoking history. She does not have any smokeless tobacco history on file. She reports that she does not drink alcohol or use illicit drugs. Additional Social History:  Family History:  No family history on file.  Results for orders placed during the hospital encounter of 01/04/14 (from the past 72 hour(s))  URINE RAPID DRUG SCREEN (HOSP PERFORMED)     Status: None   Collection Time    01/04/14 12:53 PM      Result Value Ref Range   Opiates NONE DETECTED  NONE DETECTED   Cocaine NONE DETECTED  NONE DETECTED   Benzodiazepines NONE DETECTED  NONE DETECTED   Amphetamines NONE DETECTED  NONE DETECTED   Tetrahydrocannabinol NONE DETECTED  NONE DETECTED   Barbiturates NONE DETECTED  NONE DETECTED   Comment:            DRUG SCREEN FOR MEDICAL  PURPOSES     ONLY.  IF CONFIRMATION IS NEEDED     FOR ANY PURPOSE, NOTIFY LAB     WITHIN 5 DAYS.                LOWEST DETECTABLE LIMITS     FOR URINE DRUG SCREEN     Drug Class       Cutoff (ng/mL)     Amphetamine      1000     Barbiturate      200     Benzodiazepine   527     Tricyclics       782     Opiates  300     Cocaine          300     THC              50  ACETAMINOPHEN LEVEL     Status: None   Collection Time    01/04/14  1:04 PM      Result Value Ref Range   Acetaminophen (Tylenol), Serum <15.0  10 - 30 ug/mL   Comment:            THERAPEUTIC CONCENTRATIONS VARY     SIGNIFICANTLY. A RANGE OF 10-30     ug/mL MAY BE AN EFFECTIVE     CONCENTRATION FOR MANY PATIENTS.     HOWEVER, SOME ARE BEST TREATED     AT CONCENTRATIONS OUTSIDE THIS     RANGE.     ACETAMINOPHEN CONCENTRATIONS     >150 ug/mL AT 4 HOURS AFTER     INGESTION AND >50 ug/mL AT 12     HOURS AFTER INGESTION ARE     OFTEN ASSOCIATED WITH TOXIC     REACTIONS.  CBC     Status: None   Collection Time    01/04/14  1:04 PM      Result Value Ref Range   WBC 6.2  4.0 - 10.5 K/uL   RBC 4.67  3.87 - 5.11 MIL/uL   Hemoglobin 14.3  12.0 - 15.0 g/dL   HCT 42.1  36.0 - 46.0 %   MCV 90.1  78.0 - 100.0 fL   MCH 30.6  26.0 - 34.0 pg   MCHC 34.0  30.0 - 36.0 g/dL   RDW 13.9  11.5 - 15.5 %   Platelets 232  150 - 400 K/uL  COMPREHENSIVE METABOLIC PANEL     Status: Abnormal   Collection Time    01/04/14  1:04 PM      Result Value Ref Range   Sodium 142  137 - 147 mEq/L   Potassium 5.3  3.7 - 5.3 mEq/L   Chloride 103  96 - 112 mEq/L   CO2 30  19 - 32 mEq/L   Glucose, Bld 97  70 - 99 mg/dL   BUN 12  6 - 23 mg/dL   Creatinine, Ser 0.93  0.50 - 1.10 mg/dL   Calcium 9.5  8.4 - 10.5 mg/dL   Total Protein 7.4  6.0 - 8.3 g/dL   Albumin 3.7  3.5 - 5.2 g/dL   AST 22  0 - 37 U/L   ALT 12  0 - 35 U/L   Alkaline Phosphatase 99  39 - 117 U/L   Total Bilirubin 0.3  0.3 - 1.2 mg/dL   GFR calc non Af Amer 65 (*)  >90 mL/min   GFR calc Af Amer 76 (*) >90 mL/min   Comment: (NOTE)     The eGFR has been calculated using the CKD EPI equation.     This calculation has not been validated in all clinical situations.     eGFR's persistently <90 mL/min signify possible Chronic Kidney     Disease.  ETHANOL     Status: None   Collection Time    01/04/14  1:04 PM      Result Value Ref Range   Alcohol, Ethyl (B) <11  0 - 11 mg/dL   Comment:            LOWEST DETECTABLE LIMIT FOR     SERUM ALCOHOL IS 11 mg/dL     FOR MEDICAL  PURPOSES ONLY  SALICYLATE LEVEL     Status: Abnormal   Collection Time    01/04/14  1:04 PM      Result Value Ref Range   Salicylate Lvl <3.4 (*) 2.8 - 20.0 mg/dL   Psychological Evaluations:  Assessment:   DSM5:  Depressive Disorders:  Major Depressive Disorder - Severe (296.23)  AXIS I:  Major Depression, Recurrent severe AXIS II:  Deferred AXIS III:   Past Medical History  Diagnosis Date  . Depression   . Anxiety    AXIS IV:  other psychosocial or environmental problems and problems related to social environment AXIS V:  51-60 moderate symptoms  Treatment Plan/Recommendations:   Review of chart, vital signs, medications, and notes.  1-Individual and group therapy  2-Medication management for depression and anxiety: Medications reviewed with the patient and she stated no untoward effects, unchanged. 3-Coping skills for depression, anxiety  4-Continue crisis stabilization and management  5-Address health issues--monitoring vital signs, stable  6-Treatment plan in progress to prevent relapse of depression and anxiety  Treatment Plan Summary: Daily contact with patient to assess and evaluate symptoms and progress in treatment Medication management Current Medications:  Current Facility-Administered Medications  Medication Dose Route Frequency Provider Last Rate Last Dose  . acetaminophen (TYLENOL) tablet 650 mg  650 mg Oral Q6H PRN [name], NP      . alum &  mag hydroxide-simeth (MAALOX/MYLANTA) 200-200-20 MG/5ML suspension 30 mL  30 mL Oral Q4H PRN [name], NP      . citalopram (CELEXA) tablet 20 mg  20 mg Oral Daily [name], NP   20 mg at 01/05/14 1962  . clonazePAM (KLONOPIN) tablet 0.5 mg  0.5 mg Oral TID PRN [name], NP   0.5 mg at 01/04/14 2150  . magnesium hydroxide (MILK OF MAGNESIA) suspension 30 mL  30 mL Oral Daily PRN [name], NP         Suicide Risk Assessment     Nursing information obtained from:    Demographic factors:    Current Mental Status:    Loss Factors:    Historical Factors:    Risk Reduction Factors:    Total Time spent with patient: 40 minutes  CLINICAL FACTORS:   Depression:   Anhedonia Hopelessness Impulsivity Insomnia  Psychiatric Specialty Exam:     Blood pressure 134/91, pulse 89, temperature 97.6 F (36.4 C), temperature source Oral, resp. rate 16, height $RemoveBe'5\' 5"'UzKBQkMgc$  (1.651 m), weight 87.998 kg (194 lb).Body mass index is 32.28 kg/(m^2).   General Appearance: Casual  Eye Contact::  Good  Speech:  Clear and Coherent  Volume:  Normal  Mood:  Anxious and Depressed  Affect:  Appropriate and Depressed  Thought Process:  Coherent and Goal Directed  Orientation:  Full (Time, Place, and Person)  Thought Content:  WDL  Suicidal Thoughts:  No  Homicidal Thoughts:  No  Memory:  Immediate;   Fair Recent;   Fair Remote;   Fair  Judgement:  Fair  Insight:  Fair  Psychomotor Activity:  Normal  Concentration:  Fair  Recall:  [employer] of Knowledge:Fair  Language: NA  Akathisia:  No  Handed:    AIMS (if indicated):     Assets:  Communication Skills Desire for Improvement Resilience  Sleep:       Musculoskeletal: Strength & Muscle Tone: within normal limits Gait & Station: normal Patient leans: N/A  COGNITIVE FEATURES THAT CONTRIBUTE TO RISK:  Closed-mindedness    SUICIDE RISK:  Mild:  Suicidal ideation of limited frequency, intensity, duration, and specificity.  There  are no identifiable plans, no associated intent, mild dysphoria and related symptoms, good self-control (both objective and subjective assessment), few other risk factors, and identifiable protective factors, including available and accessible social support.   Benjamine Mola, FNP-BC 01/05/2014, 2:40 PM

## 2014-01-05 NOTE — Progress Notes (Signed)
Pt. Denies SI and HI. Pt. Was safely escorted to the lockers to retrieve her belongings.  Pt. Dressed and was escorted to the front lobby.

## 2014-01-05 NOTE — BH Assessment (Signed)
[facility] Assessment Progress Note  After consulting with Nelly RoutArchana Kumar, MD and Claudette Headonrad Withrow, NP it has been determined that pt does not present a life threatening danger to herself or others, and that psychiatric hospitalization is not indicated for her at this time.  Dr Lucianne MussKumar recommends that arrangements be made for pt to [facility]  I spoke to Jeri Modenaita Clark, who indicates that the first available start date is Tuesday, 01/13/2014.  Pt is to present at 08:45 on the first day with intake paperwork filled out.  I then spoke to the pt and after clarifying some of her question, she is agreeable to this plan.  This information will be included in the pt's discharge instructions.  Gwendolyn Canninghomas Trenice Mesa, MA Triage Specialist 01/05/2014 @ 16:35

## 2014-01-06 NOTE — H&P (Signed)
Patient admitted to [facility] Observation unit for monitoring for safety

## 2014-01-06 NOTE — Discharge Summary (Signed)
Patient seen, evaluated by me. Patient contracts for safety, denies having had any suicidal thoughts for the last 48 hours. Patient can be discharged home with outpatient follow up

## 2014-01-07 NOTE — ED Provider Notes (Signed)
Medical screening examination/treatment/procedure(s) were performed by non-physician practitioner and as supervising physician I was immediately available for consultation/collaboration.   EKG Interpretation None        Lyanne CoKevin M Yanilen Adamik, MD 01/07/14 0230

## 2014-02-03 ENCOUNTER — Encounter (HOSPITAL_COMMUNITY): Payer: Self-pay | Admitting: Psychiatry

## 2014-02-03 ENCOUNTER — Ambulatory Visit (INDEPENDENT_AMBULATORY_CARE_PROVIDER_SITE_OTHER): Payer: Medicare Other | Admitting: Psychiatry

## 2014-02-03 ENCOUNTER — Encounter (INDEPENDENT_AMBULATORY_CARE_PROVIDER_SITE_OTHER): Payer: Self-pay

## 2014-02-03 VITALS — BP 118/82 | HR 85 | Ht 65.0 in | Wt 190.4 lb

## 2014-02-03 DIAGNOSIS — F319 Bipolar disorder, unspecified: Secondary | ICD-10-CM

## 2014-02-03 MED ORDER — DIVALPROEX SODIUM ER 250 MG PO TB24: ORAL_TABLET | ORAL | Status: DC
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
## 2014-02-17 ENCOUNTER — Encounter (HOSPITAL_COMMUNITY): Payer: Self-pay | Admitting: Psychiatry

## 2014-02-17 ENCOUNTER — Ambulatory Visit (INDEPENDENT_AMBULATORY_CARE_PROVIDER_SITE_OTHER): Payer: Medicare Other | Admitting: Psychiatry

## 2014-02-17 VITALS — BP 131/85 | HR 76 | Ht 65.0 in | Wt 186.6 lb

## 2014-02-17 DIAGNOSIS — F319 Bipolar disorder, unspecified: Secondary | ICD-10-CM

## 2014-02-17 DIAGNOSIS — F39 Unspecified mood [affective] disorder: Secondary | ICD-10-CM

## 2014-02-17 MED ORDER — DIVALPROEX SODIUM ER 250 MG PO TB24: ORAL_TABLET | ORAL | Status: DC

## 2014-02-17 MED ORDER — CLONAZEPAM 1 MG PO TABS: 1.0000 mg | ORAL_TABLET | Freq: Two times a day (BID) | ORAL | Status: DC | PRN

## 2014-02-17 NOTE — Progress Notes (Addendum)
[place] [phone] Progress Note   Gwendolyn Steele [id number] 63 y.o.  02/17/2014 9:05 AM  Chief Complaint:  I like Depakote.  However I still have irritability and anger.  History of Present Illness:  Gwendolyn Steele came for her followup appointment .  She was seen first time on July 14 his initial evaluation.  She is referred from OBS unit.  At that time she was having suicidal thoughts, irritability anger and paranoia.  She was taking Celexa 20 mg 3 times a day and Klonopin 1 mg 3 times a day.  She did not felt that her medicines were working .  We started her on Depakote and cut down her Celexa and Klonopin.  She is tolerating the Depakote without any side effects.  Occasionally she has headaches but she sleeping better.  She is taking Klonopin 1 mg twice a day and Celexa 20 mg daily.  She is still has irritability, anger, mood swings but they're less intense and less frequent from the past.  She gave an example that she usually takes her niece and nephew to water park and feeling uncomfortable and tried to look up corner of isolated spot where she cannot be seen by a lot of people.  However at this time she feels more confident and did not get upset even though she was unable to find a very isolated spot.  She also decided to get off from [place].  She feels that there is too much drama on the FB.  She learned that there are few other members in the family who has bipolar disorder.  She is trying to keep in touch with her sister and her son who lives in [place].  She sleeping more hours but she still have racing thoughts, irritability and some anger.  She denies any suicidal thoughts or homicidal thoughts.  She do not recall any hallucination since the last visit.  She is scheduled to see Eloise Levels on August 18.  She has no tremors or shakes.  She is not drinking or using any illegal substances.  She lives by herself.  Her appetite is okay.  Her weight is unchanged from the past.  She  is trying to learn coping and social skills.    Suicidal Ideation: No Plan Formed: No Patient has means to carry out plan: No  Homicidal Ideation: No Plan Formed: No Patient has means to carry out plan: No  Past Psychiatric History/Hospitalization(s) Patient has history of mania, psychosis, hallucinations and suicidal attempt.  She has at least 3 psychiatric admission.  In the past she had tried Abilify, Seroquel, Risperdal.  She was taking Celexa 90 mg of Klonopin 3 mg a day as prescribed by her primary care physician .  Please see initial evaluation for more details. Anxiety: Yes Bipolar Disorder: Yes Depression: Yes Mania: Yes Psychosis: Yes Schizophrenia: No Personality Disorder: No Hospitalization for psychiatric illness: Yes History of Electroconvulsive Shock Therapy: No Prior Suicide Attempts: Yes  Medical History; Patient has no active medical problems.  Her primary care physician is in [place].  Review of Systems  Gastrointestinal: Negative.   Skin: Negative for rash.  Neurological: Positive for headaches.  Psychiatric/Behavioral: The patient has insomnia.    Psychiatric: Agitation: Yes Hallucination: Yes Depressed Mood: Yes Insomnia: Yes Hypersomnia: Yes Altered Concentration: No Feels Worthless: No Grandiose Ideas: Yes Belief In Special Powers: No New/Increased Substance Abuse: No Compulsions: No  Neurologic: Headache: Yes Seizure: No Paresthesias: No    Outpatient Encounter Prescriptions as  of 02/17/2014  Medication Sig  . citalopram (CELEXA) 20 MG tablet Take 1 tablet (20 mg total) by mouth daily.  . clonazePAM (KLONOPIN) 1 MG tablet Take 1 tablet (1 mg total) by mouth 2 (two) times daily as needed for anxiety.  . divalproex (DEPAKOTE ER) 250 MG 24 hr tablet Take 3 tab at bed time  . [DISCONTINUED] clonazePAM (KLONOPIN) 1 MG tablet Take 1 tablet (1 mg total) by mouth 3 (three) times daily. She takes 3 a day, sometimes a fourth one as needed.  .  [DISCONTINUED] divalproex (DEPAKOTE ER) 250 MG 24 hr tablet Take 1 tab for 1 week and than 2 tab at bed time    Recent Results (from the past 2160 hour(s))  URINE RAPID DRUG SCREEN (HOSP PERFORMED)     Status: None   Collection Time    01/04/14 12:53 PM      Result Value Ref Range   Opiates NONE DETECTED  NONE DETECTED   Cocaine NONE DETECTED  NONE DETECTED   Benzodiazepines NONE DETECTED  NONE DETECTED   Amphetamines NONE DETECTED  NONE DETECTED   Tetrahydrocannabinol NONE DETECTED  NONE DETECTED   Barbiturates NONE DETECTED  NONE DETECTED   Comment:            DRUG SCREEN FOR MEDICAL PURPOSES     ONLY.  IF CONFIRMATION IS NEEDED     FOR ANY PURPOSE, NOTIFY LAB     WITHIN 5 DAYS.                LOWEST DETECTABLE LIMITS     FOR URINE DRUG SCREEN     Drug Class       Cutoff (ng/mL)     Amphetamine      1000     Barbiturate      200     Benzodiazepine   381     Tricyclics       829     Opiates          300     Cocaine          300     THC              50  ACETAMINOPHEN LEVEL     Status: None   Collection Time    01/04/14  1:04 PM      Result Value Ref Range   Acetaminophen (Tylenol), Serum <15.0  10 - 30 ug/mL   Comment:            THERAPEUTIC CONCENTRATIONS VARY     SIGNIFICANTLY. A RANGE OF 10-30     ug/mL MAY BE AN EFFECTIVE     CONCENTRATION FOR MANY PATIENTS.     HOWEVER, SOME ARE BEST TREATED     AT CONCENTRATIONS OUTSIDE THIS     RANGE.     ACETAMINOPHEN CONCENTRATIONS     >150 ug/mL AT 4 HOURS AFTER     INGESTION AND >50 ug/mL AT 12     HOURS AFTER INGESTION ARE     OFTEN ASSOCIATED WITH TOXIC     REACTIONS.  CBC     Status: None   Collection Time    01/04/14  1:04 PM      Result Value Ref Range   WBC 6.2  4.0 - 10.5 K/uL   RBC 4.67  3.87 - 5.11 MIL/uL   Hemoglobin 14.3  12.0 - 15.0 g/dL   HCT 42.1  36.0 - 46.0 %  MCV 90.1  78.0 - 100.0 fL   MCH 30.6  26.0 - 34.0 pg   MCHC 34.0  30.0 - 36.0 g/dL   RDW 13.9  11.5 - 15.5 %   Platelets 232  150 - 400  K/uL  COMPREHENSIVE METABOLIC PANEL     Status: Abnormal   Collection Time    01/04/14  1:04 PM      Result Value Ref Range   Sodium 142  137 - 147 mEq/L   Potassium 5.3  3.7 - 5.3 mEq/L   Chloride 103  96 - 112 mEq/L   CO2 30  19 - 32 mEq/L   Glucose, Bld 97  70 - 99 mg/dL   BUN 12  6 - 23 mg/dL   Creatinine, Ser 0.93  0.50 - 1.10 mg/dL   Calcium 9.5  8.4 - 10.5 mg/dL   Total Protein 7.4  6.0 - 8.3 g/dL   Albumin 3.7  3.5 - 5.2 g/dL   AST 22  0 - 37 U/L   ALT 12  0 - 35 U/L   Alkaline Phosphatase 99  39 - 117 U/L   Total Bilirubin 0.3  0.3 - 1.2 mg/dL   GFR calc non Af Amer 65 (*) >90 mL/min   GFR calc Af Amer 76 (*) >90 mL/min   Comment: (NOTE)     The eGFR has been calculated using the CKD EPI equation.     This calculation has not been validated in all clinical situations.     eGFR's persistently <90 mL/min signify possible Chronic Kidney     Disease.  ETHANOL     Status: None   Collection Time    01/04/14  1:04 PM      Result Value Ref Range   Alcohol, Ethyl (B) <11  0 - 11 mg/dL   Comment:            LOWEST DETECTABLE LIMIT FOR     SERUM ALCOHOL IS 11 mg/dL     FOR MEDICAL PURPOSES ONLY  SALICYLATE LEVEL     Status: Abnormal   Collection Time    01/04/14  1:04 PM      Result Value Ref Range   Salicylate Lvl <6.3 (*) 2.8 - 20.0 mg/dL      Physical Exam: Constitutional:  BP 131/85  Pulse 76  Ht _0  (1.651 m)  Wt 186 lb 9.6 oz (84.641 kg)  BMI 31.05 kg/m2  Musculoskeletal: Strength & Muscle Tone: within normal limits Gait & Station: normal Patient leans: N/A  Mental Status Examination;  Patient is casually dressed and fairly groomed.  She appears to be her stated age.  She still appears emotionally labile .  Her speech is fast but clear and coherent.  Thought process is circumstantial .  She described her mood is emotional at her affect is labile.  She denies any active or passive suicidal thoughts or homicidal thoughts.  She still has grandiosity  however there were no paranoia or delusions present at this time.  She denies any auditory or visual hallucination. Her attention and concentration is distracted. Her fund of knowledge is average.  Her psychomotor activity is slightly increased.  She is alert and oriented x3.  Her insight judgment and impulse control is fair.     Established Problem, Stable/Improving (1), Review of Psycho-Social Stressors (1), Review of Last Therapy Session (1), Review of Medication Regimen & Side Effects (2) and Review of New Medication or Change in Dosage (2)  Assessment: Axis I: Mood disorder NOS, rule out bipolar disorder with psychotic features   Axis II:  deferred   Axis III:  Past Medical History  Diagnosis Date  . Depression   . Anxiety     Axis IV: Moderate    Plan:  Patient is taking Depakote 500 mg at bedtime.  She does not have any side effects.  She still has residual symptoms of mood lability.  I would increase Depakote 750 mg at bedtime.  Recommended to take Klonopin 1 mg twice a day only as needed.  Continue on Celexa 20 mg daily.  Discuss in detail the risks and benefits of medication.  Continue to encourage watching her calories.  Patient is swimming every day at [employer].  Patient is scheduled to see Eloise Levels on August 18.  We will get Depakote level on her next visit.  Recommended to call us back if she has any question or any concern.  Followup in one month. Time spent 25 minutes.  More than 50% of the time spent in psychoeducation, counseling and coordination of care.  Discuss safety plan that anytime having active suicidal thoughts or homicidal thoughts then patient need to call 911 or go to the local emergency room.   Kida Digiulio T., MD 02/17/2014

## 2014-03-05 ENCOUNTER — Telehealth (HOSPITAL_COMMUNITY): Payer: Self-pay | Admitting: *Deleted

## 2014-03-05 NOTE — Telephone Encounter (Signed)
[id number]:having a lot of side effects with medication.Please call and advise Contacted patient-- Was on Depakote.Did well, was increased after 2 weeks.Tolerated okay at first then:Panic Attacks, headaches and insomnia.Started at 250 mg,then went to 500 mg.Tolerated okay, so increased to 750 mg.Did not tolerate, decreased it to 250. Racing thoughts have returned.She stopped it yesterday.  Per Dr. Michae KavaAgarwal (in Dr. Sheela StackArfeen's absence)Take 250 mg, if she can tolerate it---take 500 mg. Make sure take Klonopin 1 mg BID. Will give message to Dr.Arfeen on his return

## 2014-03-10 ENCOUNTER — Telehealth (HOSPITAL_COMMUNITY): Payer: Self-pay | Admitting: Psychiatry

## 2014-03-10 ENCOUNTER — Ambulatory Visit (HOSPITAL_COMMUNITY): Payer: Self-pay | Admitting: Psychiatry

## 2014-03-10 NOTE — Telephone Encounter (Signed)
Patient's phone call.  She is taking Depakote 250 mg and side effects are better.  I recommended to continue the same dose and give more time until we increased the dose on her next visit.  The patient understand and acknowledged

## 2014-03-16 ENCOUNTER — Ambulatory Visit (HOSPITAL_COMMUNITY): Payer: Self-pay | Admitting: Psychiatry

## 2014-03-23 ENCOUNTER — Ambulatory Visit (HOSPITAL_COMMUNITY): Payer: Self-pay | Admitting: Psychiatry

## 2014-05-12 ENCOUNTER — Ambulatory Visit (HOSPITAL_COMMUNITY): Payer: Self-pay | Admitting: Psychiatry

## 2014-05-26 ENCOUNTER — Ambulatory Visit (HOSPITAL_COMMUNITY): Payer: Self-pay | Admitting: Psychiatry

## 2014-06-09 ENCOUNTER — Ambulatory Visit (HOSPITAL_COMMUNITY): Payer: Self-pay | Admitting: Psychiatry

## 2014-06-23 ENCOUNTER — Ambulatory Visit (HOSPITAL_COMMUNITY): Payer: Self-pay | Admitting: Psychiatry

## 2015-03-20 ENCOUNTER — Emergency Department: Payer: Medicare Other

## 2015-03-20 ENCOUNTER — Emergency Department
Admission: EM | Admit: 2015-03-20 | Discharge: 2015-03-20 | Disposition: A | Discharge status: Home / Self Care | Payer: Medicare Other | Attending: Emergency Medicine | Admitting: Emergency Medicine

## 2015-03-20 ENCOUNTER — Encounter: Payer: Self-pay | Admitting: Emergency Medicine

## 2015-03-20 DIAGNOSIS — Y998 Other external cause status: Secondary | ICD-10-CM | POA: Insufficient documentation

## 2015-03-20 DIAGNOSIS — Z79891 Long term (current) use of opiate analgesic: Secondary | ICD-10-CM | POA: Diagnosis not present

## 2015-03-20 DIAGNOSIS — S63259A Unspecified dislocation of unspecified finger, initial encounter: Secondary | ICD-10-CM

## 2015-03-20 DIAGNOSIS — W010XXA Fall on same level from slipping, tripping and stumbling without subsequent striking against object, initial encounter: Secondary | ICD-10-CM | POA: Diagnosis not present

## 2015-03-20 DIAGNOSIS — Z79899 Other long term (current) drug therapy: Secondary | ICD-10-CM | POA: Diagnosis not present

## 2015-03-20 DIAGNOSIS — Y9389 Activity, other specified: Secondary | ICD-10-CM | POA: Insufficient documentation

## 2015-03-20 DIAGNOSIS — Z720 Tobacco use: Secondary | ICD-10-CM | POA: Diagnosis not present

## 2015-03-20 DIAGNOSIS — Y9289 Other specified places as the place of occurrence of the external cause: Secondary | ICD-10-CM | POA: Diagnosis not present

## 2015-03-20 DIAGNOSIS — S63237A Subluxation of proximal interphalangeal joint of left little finger, initial encounter: Secondary | ICD-10-CM | POA: Insufficient documentation

## 2015-03-20 DIAGNOSIS — S63287A Dislocation of proximal interphalangeal joint of left little finger, initial encounter: Secondary | ICD-10-CM | POA: Insufficient documentation

## 2015-03-20 DIAGNOSIS — S6992XA Unspecified injury of left wrist, hand and finger(s), initial encounter: Secondary | ICD-10-CM | POA: Diagnosis present

## 2015-03-20 MED ORDER — OXYCODONE-ACETAMINOPHEN 5-325 MG PO TABS
1.0000 | ORAL_TABLET | Freq: Once | ORAL | Status: AC
  Administered 2015-03-20: 1 via ORAL

## 2015-03-20 MED ORDER — OXYCODONE-ACETAMINOPHEN 7.5-325 MG PO TABS: 1.0000 | ORAL_TABLET | Freq: Four times a day (QID) | ORAL | Status: DC | PRN

## 2015-03-20 MED ORDER — OXYCODONE-ACETAMINOPHEN 5-325 MG PO TABS
ORAL_TABLET | ORAL | Status: AC
  Filled 2015-03-20: qty 1

## 2015-03-20 MED ORDER — OXYCODONE-ACETAMINOPHEN 5-325 MG PO TABS: 1.0000 | ORAL_TABLET | Freq: Once | ORAL | Status: DC

## 2015-03-20 MED ORDER — LIDOCAINE HCL (PF) 1 % IJ SOLN
5.0000 mL | Freq: Once | INTRAMUSCULAR | Status: DC
  Filled 2015-03-20: qty 5

## 2015-03-20 MED ORDER — ONDANSETRON 4 MG PO TBDP
ORAL_TABLET | ORAL | Status: AC
  Filled 2015-03-20: qty 1

## 2015-03-20 MED ORDER — ONDANSETRON 4 MG PO TBDP
4.0000 mg | ORAL_TABLET | Freq: Once | ORAL | Status: AC | PRN
  Administered 2015-03-20: 4 mg via ORAL

## 2015-03-20 NOTE — Discharge Instructions (Signed)
Wear splint for 3-5 days pending Ortho evaluation. Finger Dislocation Finger dislocation is the displacement of bones in your finger at the joints. Most commonly, finger dislocation occurs at the proximal interphalangeal joint (the joint closest to your knuckle). Very strong, fibrous tissues (ligaments) and joint capsules connect the three bones of your fingers.  CAUSES Dislocation is caused by a forceful impact. This impact moves these bones off the joint and often tears your ligaments.  SYMPTOMS Symptoms of finger dislocation include:  Deformity of your finger.  Pain, with loss of movement. DIAGNOSIS  Finger dislocation is diagnosed with a physical exam. Often, X-ray exams are done to see if you have associated injuries, such as bone fractures. TREATMENT  Finger dislocations are treated by putting your bones back into position (reduction) either by manually moving the bones back into place or through surgery. Your finger is then kept in a fixed position (immobilized) with the use of a dressing or splint for a brief period. When your ligament has to be surgically repaired, it needs to be kept in a fixed position with a dressing or splint for 1 to 2 weeks. Because joint stiffness is a long-term complication of finger dislocation, hand exercises or physical therapy to increase the range of motion and to regain strength is usually started as soon as the ligament is healed. Exercises and therapy generally last no more than 3 months. HOME CARE INSTRUCTIONS The following measures can help to reduce pain and speed up the healing process:  Rest your injured joint. Do not move until instructed otherwise by your caregiver. Avoid activities similar to the one that caused your injury.  Apply ice to your injured joint for the first day or 2 after your reduction or as directed by your caregiver. Applying ice helps to reduce inflammation and pain.  Put ice in a plastic bag.  Place a towel between your  skin and the bag.  Leave the ice on for 15-20 minutes at a time, every 2 hours while you are awake.  Elevate your hand above your heart as directed by your caregiver to reduce swelling.  Take over-the-counter or prescription medicine for pain as your caregiver instructs you. SEEK IMMEDIATE MEDICAL CARE IF:  Your dressing or splint becomes damaged.  Your pain becomes worse rather than better.  You lose feeling in your finger, or it becomes cold and white. MAKE SURE YOU:  Understand these instructions.  Will watch your condition.  Will get help right away if you are not doing well or get worse. Document Released: 07/07/2000 Document Revised: 10/02/2011 Document Reviewed: 04/30/2011 [facility] Patient Information 2015 [place], [place]. This information is not intended to replace advice given to you by your health care provider. Make sure you discuss any questions you have with your health care provider.  Cast or Splint Care Casts and splints support injured limbs and keep bones from moving while they heal. It is important to care for your cast or splint at home.  HOME CARE INSTRUCTIONS  Keep the cast or splint uncovered during the drying period. It can take 24 to 48 hours to dry if it is made of plaster. A fiberglass cast will dry in less than 1 hour.  Do not rest the cast on anything harder than a pillow for the first 24 hours.  Do not put weight on your injured limb or apply pressure to the cast until your health care provider gives you permission.  Keep the cast or splint dry. Wet casts or  splints can lose their shape and may not support the limb as well. A wet cast that has lost its shape can also create harmful pressure on your skin when it dries. Also, wet skin can become infected.  Cover the cast or splint with a plastic bag when bathing or when out in the rain or snow. If the cast is on the trunk of the body, take sponge baths until the cast is removed.  If your cast does  become wet, dry it with a towel or a blow dryer on the cool setting only.  Keep your cast or splint clean. Soiled casts may be wiped with a moistened cloth.  Do not place any hard or soft foreign objects under your cast or splint, such as cotton, toilet paper, lotion, or powder.  Do not try to scratch the skin under the cast with any object. The object could get stuck inside the cast. Also, scratching could lead to an infection. If itching is a problem, use a blow dryer on a cool setting to relieve discomfort.  Do not trim or cut your cast or remove padding from inside of it.  Exercise all joints next to the injury that are not immobilized by the cast or splint. For example, if you have a long leg cast, exercise the hip joint and toes. If you have an arm cast or splint, exercise the shoulder, elbow, thumb, and fingers.  Elevate your injured arm or leg on 1 or 2 pillows for the first 1 to 3 days to decrease swelling and pain.It is best if you can comfortably elevate your cast so it is higher than your heart. SEEK MEDICAL CARE IF:   Your cast or splint cracks.  Your cast or splint is too tight or too loose.  You have unbearable itching inside the cast.  Your cast becomes wet or develops a soft spot or area.  You have a bad smell coming from inside your cast.  You get an object stuck under your cast.  Your skin around the cast becomes red or raw.  You have new pain or worsening pain after the cast has been applied. SEEK IMMEDIATE MEDICAL CARE IF:   You have fluid leaking through the cast.  You are unable to move your fingers or toes.  You have discolored (blue or white), cool, painful, or very swollen fingers or toes beyond the cast.  You have tingling or numbness around the injured area.  You have severe pain or pressure under the cast.  You have any difficulty with your breathing or have shortness of breath.  You have chest pain. Document Released: 07/07/2000 Document  Revised: 04/30/2013 Document Reviewed: 01/16/2013 [facility] Patient Information 2015 [place], [place]. This information is not intended to replace advice given to you by your health care provider. Make sure you discuss any questions you have with your health care provider.

## 2015-03-20 NOTE — ED Provider Notes (Signed)
[facility] Emergency Department Provider Note  ____________________________________________  Time seen: Approximately 6:13 PM  I have reviewed the triage vital signs and the nursing notes.   HISTORY  Chief Complaint Hand Pain    HPI Gwendolyn Steele is a 64 y.o. female patient complaining of deformity to the fifth digit left hand secondary to a fall. Patient states she slipped and broke fall with the left hand causing deformity..   Past Medical History  Diagnosis Date  . Depression   . Anxiety     Patient Active Problem List   Diagnosis Date Noted  . Depressive disorder 01/04/2014    Past Surgical History  Procedure Laterality Date  . Appendectomy    . Stomach surgery      tummy tuck  . Stomach surgery      Current Outpatient Rx  Name  Route  Sig  Dispense  Refill  . citalopram (CELEXA) 20 MG tablet   Oral   Take 1 tablet (20 mg total) by mouth daily.         . clonazePAM (KLONOPIN) 1 MG tablet   Oral   Take 1 tablet (1 mg total) by mouth 2 (two) times daily as needed for anxiety.   60 tablet   0   . divalproex (DEPAKOTE ER) 250 MG 24 hr tablet      Take 3 tab at bed time   90 tablet   0   . oxyCODONE-acetaminophen (PERCOCET) 7.5-325 MG per tablet   Oral   Take 1 tablet by mouth every 6 (six) hours as needed for severe pain.   12 tablet   0   . oxyCODONE-acetaminophen (PERCOCET/ROXICET) 5-325 MG per tablet   Oral   Take 1 tablet by mouth once.   30 tablet   0     Allergies Abilify  No family history on file.  Social History Social History  Substance Use Topics  . Smoking status: Current Every Day Smoker -- 1.00 packs/day for 45 years    Types: Cigarettes  . Smokeless tobacco: None  . 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 ENT: No sore throat. Cardiovascular: Denies chest pain. Respiratory: Denies shortness of  breath. Gastrointestinal: No abdominal pain.  No nausea, no vomiting.  No diarrhea.  No constipation. Genitourinary: Negative for dysuria. Musculoskeletal: Ulnar deviation of the fifth digit left hand.  Skin: Negative for rash. Neurological: Negative for headaches, focal weakness or numbness. Psychiatric:Depression and anxiety Endocrine: Hematological/Lymphatic: Allergic/Immunilogical: Medication list 10-point ROS otherwise negative.  ____________________________________________   PHYSICAL EXAM:  VITAL SIGNS: ED Triage Vitals  Enc Vitals Group     BP 03/20/15 1801 148/84 mmHg     Pulse Rate 03/20/15 1801 85     Resp 03/20/15 1801 20     Temp 03/20/15 1801 98.3 F (36.8 C)     Temp Source 03/20/15 1801 Oral     SpO2 03/20/15 1801 96 %     Weight 03/20/15 1801 188 lb (85.276 kg)     Height 03/20/15 1801  (1.626 m)     Head Cir --      Peak Flow --      Pain Score 03/20/15 1801 8     Pain Loc --      Pain Edu? --      Excl. in GC? --     Constitutional: Alert and oriented. Well appearing  and in no acute distress. Eyes: Conjunctivae are normal. PERRL. EOMI. Head: Atraumatic. Nose: No congestion/rhinnorhea. Mouth/Throat: Mucous membranes are moist.  Oropharynx non-erythematous. Neck: No stridor. No cervical spine tenderness to palpation. Hematological/Lymphatic/Immunilogical: No cervical lymphadenopathy. Cardiovascular: Normal rate, regular rhythm. Grossly normal heart sounds.  Good peripheral circulation. Respiratory: Normal respiratory effort.  No retractions. Lungs CTAB. Gastrointestinal: Soft and nontender. No distention. No abdominal bruits. No CVA tenderness. Musculoskeletal: Subluxation fifth digit left hand. Neurologic:  Normal speech and language. No gross focal neurologic deficits are appreciated. No gait instability. Skin:  Skin is warm, dry and intact. No rash noted. Psychiatric: Mood and affect are normal. Speech and behavior are  normal.  ____________________________________________   LABS (all labs ordered are listed, but only abnormal results are displayed)  Labs Reviewed - No data to display ____________________________________________  EKG   ____________________________________________  RADIOLOGY  Dorsal displacement of the proximal fifth interphalangeal left hand.  I, Joni Reining, personally viewed and evaluated these images (plain radiographs) as part of my medical decision making.   ____________________________________________   PROCEDURES  Procedure(s) performed: None  Critical Care performed: No  ____________________________________________   INITIAL IMPRESSION / ASSESSMENT AND PLAN / ED COURSE  Pertinent labs & imaging results that were available during my care of the patient were reviewed by me and considered in my medical decision making (see chart for details).  Substance subluxation of the proximal phalange  fifth digit left hand. Patient was given a digital block and manual reduction of the fifth digit left hand. Patient placed in a finger splint and advised to follow orthopedics secondary to suspected for volar plate injury. injury.____________________________________________   FINAL CLINICAL IMPRESSION(S) / ED DIAGNOSES  Final diagnoses:  Dislocated finger, initial encounter      Joni Reining, PA-C 03/20/15 1906  Darien Ramus, MD 03/21/15 [phone]

## 2015-03-20 NOTE — ED Notes (Signed)
Patient presents to the ED with a deformed 5th digit on her left hand.  Patient states she slipped and fell on her finger.  Finger is bent outward at the second joint.

## 2015-04-27 ENCOUNTER — Ambulatory Visit (INDEPENDENT_AMBULATORY_CARE_PROVIDER_SITE_OTHER): Payer: Medicare Other | Admitting: Psychiatry

## 2015-04-27 DIAGNOSIS — F319 Bipolar disorder, unspecified: Secondary | ICD-10-CM

## 2015-04-30 NOTE — Progress Notes (Signed)
[facility] Behavioral Health Biopsychosocial Assessment  Gwendolyn Steele 64 y.o. 04/30/2015   Referred by: Dr. Lolly Mustache   PRESENTING PROBLEM Chief Complaint: Depression What are the main stressors in your life right now? Relationship with her older sister who is verbally abusive to her and triggers her depression.  Depression  1   Describe a brief history of your present symptoms: Pt. Presents with depression. Pt. Reports that she began having feelings of depression in Dec 19, 2002 after the death of her mother.    FAMILY ASSESSMENT Was the significant other/family member interviewed? No If No, why?: n/a Is significant other/family member supportive? NA Did significant other/family member express concerns for the patient? NA   Is significant other/family member willing to be part of treatment plan? No  MENTAL HEALTH HISTORY Have you ever been treated for a mental health problem? Yes   Are you currently seeing a therapist or counselor? No If Yes, whom? n/a Have you ever had a mental health hospitalization? No  Have you ever had suicidal thoughts or attempted suicide? Yes If Yes, when? Pt. Reports that she has passive thoughts of suicide sometimes while driving, but has not plan, no immediacy. Pt. Reports that she wants to seek treatment for her depression, develop self-care habits, patterns of co-dependency and ignoring her emotional needs that have have contributed to her depression.   MARITAL STATUS Are you presently: Divorced How many times have you been married? one  Do you have any children? Yes If Yes, how many? Pt. Has one son who is 86 years old. Pt.'s son lives with his wife in [place]. Pt. Reports that she had him at 64 years of age and spoiled him. Pt. Reports that his wife prevents her from visiting as much as she would like.    LEISURE/RECREATION Describe how patient spends leisure time: Pt. 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    SOCIAL  AND FAMILY HISTORY Who lives in your current household? Pt. Lives with her niece, grand nephews and niece Where were you born? Pt. Was born in [place] Where did you grow up? Pt. Was raised in [place] Describe the household where you grew up: Pt. Had close and loving relationship with her mother.  Do you have siblings, step/half siblings? Yes. Pt. Has a sister Chip Boer) who is one year older and is dying from liver disease.  Are your parents still living? No   Are you having problems with any member or your family? Yes. Pt. Reports that her relationship with her sister is her most significant stressor. Her sister has liver disease and is often verbally abusive to her.    What Religion are you? Christian Do you have any cultural or religious beliefs which could impact your treatment? No If Yes, please explain (including customs, celebrations, attitude towards alcohol and drugs, authority in family, etc):  n/a  Have you ever been in the [employer]? No  If Yes, please explain: n/a   EDUCATIONAL BACKGROUND How many grades have you completed? high school diploma/GED Do you hold any Degrees? No   Did you have any problems in school? No If Yes, were these problems behavioral, attentional, or due to learning difficulties? no Were any medications ever prescribed for these problems? No If Yes, what were the medications, including the dosage, how long you took these and who prescribed them? n/a   WORK HISTORY Do you work? No If Yes, what is your occupation? retired How long have you  been employed there? 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 No If Yes, please explain: n/a  Does your spouse work?  divorced If Yes, where and for how long? n/a Are you under financial stress? No If Yes, please explain: n/a  Financial Resources  Patient is: Self supportive (no assistance) Yes    Requires referral for financial assistance No  Requires referral for credit counseling No  Current situation affects financial situation no  Adolescent/child in need of financial support no Is there anything else you would like to tell us? no  Shaune Pollack, [facility] 04/30/2015

## 2015-05-18 ENCOUNTER — Ambulatory Visit (HOSPITAL_COMMUNITY): Payer: Self-pay | Admitting: Psychiatry

## 2015-05-20 ENCOUNTER — Ambulatory Visit (INDEPENDENT_AMBULATORY_CARE_PROVIDER_SITE_OTHER): Payer: Medicare Other | Admitting: Psychiatry

## 2015-05-20 ENCOUNTER — Encounter (HOSPITAL_COMMUNITY): Payer: Self-pay | Admitting: Psychiatry

## 2015-05-20 VITALS — BP 129/93 | HR 76 | Wt 189.8 lb

## 2015-05-20 DIAGNOSIS — F39 Unspecified mood [affective] disorder: Secondary | ICD-10-CM | POA: Diagnosis not present

## 2015-05-20 DIAGNOSIS — F319 Bipolar disorder, unspecified: Secondary | ICD-10-CM

## 2015-05-20 MED ORDER — CLONAZEPAM 1 MG PO TABS: 1.0000 mg | ORAL_TABLET | Freq: Two times a day (BID) | ORAL | Status: AC | PRN

## 2015-05-20 MED ORDER — DIVALPROEX SODIUM ER 250 MG PO TB24: 250.0000 mg | ORAL_TABLET | Freq: Two times a day (BID) | ORAL | Status: DC

## 2015-05-20 NOTE — Progress Notes (Signed)
[facility] Behavioral Health [address] Progress Note   Gwendolyn Steele [id number] 64 y.o.  05/20/2015 9:46 AM  Chief Complaint:  I think I need medication.  I'm not taking Depakote but I need something to help my mood.    History of Present Illness:  Gwendolyn Steele is a 64 year old Caucasian unemployed divorced female who has seen in this office 1 year ago when she was referred from OBS unit.  She was admitted there because of having suicidal thoughts and then she was recommended intensive outpatient program but she did not attend due to financial reasons.  We started her on Depakote however patient moved back to [place] to live close to her family members.  Her son, sister and brother live in [place].  She stopped taking Depakote because her primary care physician did not renew the medication.  She is taking Klonopin and now taking 1 mg 3 times a day and forth as needed.  She continued on Celexa 30 mg daily.  Despite taking the medication she continues to have irritability, anger, mood swing, feeling highs and lows.  She admitted having auditory hallucination and paranoia.  She does not leave her house unless it is important.  She admitted having impulsive behavior and gets easily frustrated and irritable.  She admitted impulsive buying and getting easily agitated.  Patient moved back this June to live close to her niece who is studying nursing.  Patient admitted still having fleeting and passive suicidal thoughts and now she is serious to get help for her bipolar disorder.  She also started seeing Victorino Dike for counseling.  Patient denies drinking or using any illegal substances.  She mentioned that due to concerns she has forgetfulness she had see neurologist in [place] and she was pleased that her spinal tap is normal.  She do not recall having any MRI or any other past.  She is taking Klonopin 1 mg 3 times a day and forth as needed.  She wanted to establish care in this office.  Her appetite is okay.   Her vitals are stable.  Suicidal Ideation: No Plan Formed: No Patient has means to carry out plan: No  Homicidal Ideation: No Plan Formed: No Patient has means to carry out plan: No  Past Psychiatric History/Hospitalization(s) Patient endorse history of psychiatric illness started in 1990 when she broke up with her boyfriend.  She has at least 3 inpatient psychiatric treatment and 3 suicidal attempt.  She was admitted to OBS unit in 2015 due to suicidal thoughts.  In the past she had tried taking overdose on Tylenol which requires ICU treatment and ventilation.  Patient has history of delusions, grandiosity, mania, impulsive behavior, hallucination, severe depression and paranoid thinking.  She had tried Abilify, Seroquel, Risperdal.  She has seen psychiatrist in [place] however when she took overdose on Tylenol she was fired from the office.  She is getting Klonopin and Celexa from her primary care physician.  We started her on Depakote which helped her but she moved to [place] and it was discontinued.   Anxiety: Yes Bipolar Disorder: Yes Depression: Yes Mania: Yes Psychosis: Yes Schizophrenia: No Personality Disorder: No Hospitalization for psychiatric illness: Yes History of Electroconvulsive Shock Therapy: No Prior Suicide Attempts: Yes  Medical History; Patient has no active medical problems.  Her primary care physician Dr Zipporah Plants in [place].  Substance Abuse History; patient endorsed history of heavy drinking in the past but claims to be sober in recent years. She denies any illegal substance use.  Family history Patient endorsed multiple family member has bipolar disorder.  Psychosocial History; Patient lives in [place] most of her life. She divorced twice. She has one son who lives in [place].Her sister and mother lives in [place].  Patient moved back to normal, and now living with her niece.   Review of Systems  Constitutional:  Negative.   Cardiovascular: Negative for chest pain and palpitations.  Gastrointestinal: Negative.   Musculoskeletal: Negative.   Skin: Negative for itching and rash.  Neurological: Positive for headaches. Negative for dizziness, tingling and tremors.  Psychiatric/Behavioral: The patient is nervous/anxious and has insomnia.    Psychiatric: Agitation: Irritability Hallucination: No Depressed Mood: Yes Insomnia: Yes Hypersomnia: No Altered Concentration: No Feels Worthless: No Grandiose Ideas: No Belief In Special Powers: No New/Increased Substance Abuse: No Compulsions: No  Neurologic: Headache: Yes Seizure: No Paresthesias: No    Outpatient Encounter Prescriptions as of 05/20/2015  Medication Sig  . citalopram (CELEXA) 20 MG tablet Take 1 tablet (20 mg total) by mouth daily.  . clonazePAM (KLONOPIN) 1 MG tablet Take 1 tablet (1 mg total) by mouth 2 (two) times daily as needed for anxiety.  . divalproex (DEPAKOTE ER) 250 MG 24 hr tablet Take 1 tablet (250 mg total) by mouth 2 (two) times daily.  Marland Kitchen. oxyCODONE-acetaminophen (PERCOCET) 7.5-325 MG per tablet Take 1 tablet by mouth every 6 (six) hours as needed for severe pain.  . [DISCONTINUED] clonazePAM (KLONOPIN) 1 MG tablet Take 1 tablet (1 mg total) by mouth 2 (two) times daily as needed for anxiety.  . [DISCONTINUED] divalproex (DEPAKOTE ER) 250 MG 24 hr tablet Take 3 tab at bed time   No facility-administered encounter medications on file as of 05/20/2015.    No results found for this or any previous visit (from the past 2160 hour(s)).    Physical Exam: Constitutional:  BP 129/93 mmHg  Pulse 76  Wt 189 lb 12.8 oz (86.093 kg)  Musculoskeletal: Strength & Muscle Tone: within normal limits Gait & Station: normal Patient leans: N/A  Mental Status Examination;  Patient is casually dressed and fairly groomed.  She appears to be in her stated age.  She is cooperative but emotional.  Her speech is fast but clear and  coherent.  Her thought process is circumstantial .  She described her mood is emotional and her affect is labile.  She denies any active or passive suicidal thoughts or homicidal thoughts.  She has grandiosity and paranoia but no delusions or any obsessive thoughts.  She denies any auditory or visual hallucination. Her attention and concentration is distracted. Her fund of knowledge is average.  Her psychomotor activity is slightly increased.  She is alert and oriented x3.  Her insight judgment and impulse control is fair.     Established Problem, Stable/Improving (1), Review of Psycho-Social Stressors (1), Decision to obtain old records (1), Review and summation of old records (2), Established Problem, Worsening (2), Review of Last Therapy Session (1), Review of Medication Regimen & Side Effects (2) and Review of New Medication or Change in Dosage (2)  Assessment: Axis I: Mood disorder NOS, rule out bipolar disorder with psychotic features   Axis II:  deferred   Axis III:  Past Medical History  Diagnosis Date  . Depression   . Anxiety     Plan:  I review her psychosocial stressors, previous history, current medication and history.  Patient had a good response with Depakote.  We will start Depakote 250 mg twice a day to help  her irritability, insomnia, mood swing and anger issues.  We talked about Klonopin , she is taking up to 4 mg some days and now she is out of her refills.  She does not want to go [place] to fill her prescription.  She wants to establish care in this office.  She admitted that she like to slowly come off from Klonopin and she agreed to go back on Klonopin 1 mg twice a day.  We discussed benzodiazepine dependence, tolerance and withdrawal.  I also encouraged her to see Boneta Lucks for counseling.  Patient is taking Celexa 30 mg daily and she still has refills remaining.  Discussed medication side effects and benefits.  Encouraged to keep appointment with a therapist.   Patient is looking for primary care physician in this area.  Discuss safety plan that anytime having active suicidal thoughts or homicidal thoughts and she need to call 911 or go to the local emergency room.  Follow-up in 4 weeks.  Sherald Balbuena T., MD 05/20/2015

## 2015-06-01 ENCOUNTER — Ambulatory Visit (INDEPENDENT_AMBULATORY_CARE_PROVIDER_SITE_OTHER): Payer: Medicare Other | Admitting: Psychiatry

## 2015-06-01 DIAGNOSIS — F319 Bipolar disorder, unspecified: Secondary | ICD-10-CM
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
## 2015-06-21 ENCOUNTER — Ambulatory Visit (HOSPITAL_COMMUNITY): Payer: Self-pay | Admitting: Psychiatry

## 2015-06-24 ENCOUNTER — Telehealth (HOSPITAL_COMMUNITY): Payer: Self-pay

## 2015-06-24 DIAGNOSIS — F319 Bipolar disorder, unspecified: Secondary | ICD-10-CM

## 2015-06-24 MED ORDER — DIVALPROEX SODIUM ER 250 MG PO TB24: 250.0000 mg | ORAL_TABLET | Freq: Two times a day (BID) | ORAL | Status: DC

## 2015-06-24 NOTE — Telephone Encounter (Signed)
Telephone call with patient reporting she has been out of Depakote 2 days and needs a new order.  States her next appointment is not until 07/05/15 and would like filled.  Telephone call with Dr. Lolly MustacheArfeen who authorized a one time refill.  New Depakote order e-scribed to patient's [facility] pharmacy in [place] as requested.

## 2015-06-28 ENCOUNTER — Ambulatory Visit (HOSPITAL_COMMUNITY): Payer: Self-pay | Admitting: Psychiatry

## 2015-07-05 ENCOUNTER — Ambulatory Visit (HOSPITAL_COMMUNITY): Payer: Self-pay | Admitting: Psychiatry

## 2015-07-08 ENCOUNTER — Ambulatory Visit (HOSPITAL_COMMUNITY): Payer: Self-pay | Admitting: Psychiatry

## 2015-08-05 ENCOUNTER — Other Ambulatory Visit (HOSPITAL_COMMUNITY): Payer: Self-pay | Admitting: Psychiatry

## 2015-08-12 ENCOUNTER — Ambulatory Visit (INDEPENDENT_AMBULATORY_CARE_PROVIDER_SITE_OTHER): Payer: Medicare Other | Admitting: Psychiatry

## 2015-08-12 ENCOUNTER — Encounter (HOSPITAL_COMMUNITY): Payer: Self-pay | Admitting: Psychiatry

## 2015-08-12 VITALS — BP 129/85 | HR 98 | Ht 64.5 in | Wt 187.6 lb

## 2015-08-12 DIAGNOSIS — F319 Bipolar disorder, unspecified: Secondary | ICD-10-CM

## 2015-08-13 NOTE — Progress Notes (Signed)
   THERAPIST PROGRESS NOTE  Session Time: 3:10-4:00  Participation Level: Active  Behavioral Response: CasualAlertAnxious  Type of Therapy: Individual Therapy  Treatment Goals addressed: Anxiety  Interventions: Supportive  Summary: Gwendolyn Steele is a 65 y.o. female who presents with anxiety.   Suicidal/Homicidal: Nowithout intent/plan  Therapist Response: Pt. Reports that she feels very anxious. Pt. Reports that since our last session she had to return to [place] twice for funerals for relatives and that she has been grieving. Pt. Reports that she continues to actively engage in caregiving for her nephews and niece and does very little for herself. Pt. Reports that her nephew's sister and son are now living in the home and that she is very sensitive to the number of people in in the home and the high level of noise. Counselor recommended ear plugs and a noise and/or nature sounds machine to help with sensory deprivation. Pt. Was given information about local support groups and encouraged to enroll in session at mental health association. Pt. Has not taken depakote for the last month because she ran out of prescription and has not rescheduled appointment with Dr. Lolly Mustache. Pt. Was instructed to reschedule with Dr. Lolly Mustache at checkout. Pt. Reports that she does not have problem with alcohol but has approximately two drinks a day that help some with her anxiety. Pt. Expressed awareness of potential for medicating with alcohol and preference for managing with prescription medication. Session focused on developing social support and reconnecting with provider.   Plan: Return again in 2-4weeks.  Diagnosis:Axis I: Anxiety Disorder NOS  Axis II: No diagnosis   Shaune Pollack, [facility] 08/13/2015

## 2015-08-16 ENCOUNTER — Other Ambulatory Visit (HOSPITAL_COMMUNITY): Payer: Self-pay | Admitting: Psychiatry

## 2015-08-16 DIAGNOSIS — F319 Bipolar disorder, unspecified: Secondary | ICD-10-CM

## 2015-08-16 MED ORDER — DIVALPROEX SODIUM ER 250 MG PO TB24: 250.0000 mg | ORAL_TABLET | Freq: Two times a day (BID) | ORAL | Status: DC

## 2015-08-25 ENCOUNTER — Ambulatory Visit (INDEPENDENT_AMBULATORY_CARE_PROVIDER_SITE_OTHER): Payer: Medicare Other | Admitting: Psychiatry

## 2015-08-25 ENCOUNTER — Encounter (HOSPITAL_COMMUNITY): Payer: Self-pay | Admitting: Psychiatry

## 2015-08-25 VITALS — BP 111/73 | HR 106 | Ht 64.0 in | Wt 188.2 lb

## 2015-08-25 DIAGNOSIS — F319 Bipolar disorder, unspecified: Secondary | ICD-10-CM

## 2015-08-25 DIAGNOSIS — F419 Anxiety disorder, unspecified: Secondary | ICD-10-CM | POA: Diagnosis not present

## 2015-08-25 MED ORDER — DIVALPROEX SODIUM ER 250 MG PO TB24: 250.0000 mg | ORAL_TABLET | Freq: Two times a day (BID) | ORAL | Status: DC

## 2015-08-25 NOTE — Progress Notes (Addendum)
[facility] [id number] Progress Note  Gwendolyn Steele [id number] 65 y.o.  08/25/2015 1:59 PM  Chief Complaint:  I am out of medication.  I missed appointment.  I'm not doing good.  I have a lot of irritability anger and mood swings.    History of Present Illness:  Gwendolyn Steele came for her follow-up appointment.  She was seen last time in October and at that time she was given Depakote.  She felt much improvement with the Depakote however she missed appointment and for past 3 weeks she has been noncompliant with medication.  Patient told his cousin and a very close friend died in 08/14/23 in [place] .  Patient told due to loss of 2 people she has been experiencing very sad , depressed and going through grief.  She recently returned from [place] and was started counseling.  In [place] she saw her primary care physician Dr. Antionette Poles who continued her Klonopin and Celexa but did not give Depakote.  She admitted taking Klonopin 3 times a day until recently her sister who is a nurse reinforce to follow recommendation of psychiatrist and she started taking Klonopin twice a day.  She feels proud that she is able to cut down Klonopin.  In the beginning she has withdrawal symptoms but now she is feeling better.  However she admitted poor sleep, racing thoughts, irritability, mood swing and flight of ideas.  She admitted impulsive behavior when she bought gold ring but she is wearing.  She told it was a goodbye and she do not regret it.  She continues Celexa 20 mg daily.  She has no shakes or tremors.  She denies any paranoia or any hallucination.  She admitted some time crying spells and feels very nervous and anxious but also endorsed Depakote help her symptoms and her family was very pleased to see the response.  She like to go back on Depakote.  She do not recall any side effects.  She did not notice any weight gain with Depakote.  She denies any drinking or using any illegal substances.  Her  appetite is okay.  Her vitals are stable.  She is seeing Victorino Dike for counseling.  Suicidal Ideation: No Plan Formed: No Patient has means to carry out plan: No  Homicidal Ideation: No Plan Formed: No Patient has means to carry out plan: No  Past Psychiatric History/Hospitalization(s) Patient has history of psychiatric illness since 70 when she had broken up with a boyfriend.  She was hospitalized for 30 days in [place].  She has at least 3 inpatient psychiatric treatment and at least 3 suicidal attempt.  She remember taking the overdose on Tylenol which requires ventilator and ICU treatment.  Patient endorses history of highs and lows, delusions, grandiosity, mania, hallucination, severe depression, irritability, impulsive behavior and paranoid thinking.  She had tried Abilify, Seroquel and Risperdal.  She remember after taking Abilify she got more suicidal.  She gained weight from Seroquel.  She was seeing psychiatrist in [place] until she was fired from his office and then start getting Klonopin and Celexa from primary care physician.   Anxiety: Yes Bipolar Disorder: Yes Depression: Yes Mania: Yes Psychosis: Yes Schizophrenia: No Personality Disorder: No Hospitalization for psychiatric illness: Yes History of Electroconvulsive Shock Therapy: No Prior Suicide Attempts: Yes  Medical History;  patient has no active medical problems.  Her primary care physician is in .  Family History; Patient denies any family history of psychiatric illness.    Psychosocial History; Patient lives in  [place] most of her life.  She divorced twice.  She has one son who lives in [place].  However she does not get along very well with her son's wife.  She has close family in [place].  She's been visiting every year [place] and usually she stays 4 month in [place].  She lives by herself but recently her niece living with her.  Substance Abuse History;   patient endorsed history of heavy drinking in the past but claims to be sober in recent years.  She denies any illegal substance use.     Review of Systems: Psychiatric: Agitation: Yes Hallucination: No Depressed Mood: Yes Insomnia: Yes Hypersomnia: No Altered Concentration: No Feels Worthless: No Grandiose Ideas: No Belief In Special Powers: No New/Increased Substance Abuse: No Compulsions: No  Neurologic: Headache: No Seizure: No Paresthesias: No    Outpatient Encounter Prescriptions as of 08/25/2015  Medication Sig  . citalopram (CELEXA) 20 MG tablet Take 1 tablet (20 mg total) by mouth daily.  . clonazePAM (KLONOPIN) 1 MG tablet Take 1 tablet (1 mg total) by mouth 2 (two) times daily as needed for anxiety.  . divalproex (DEPAKOTE ER) 250 MG 24 hr tablet Take 1 tablet (250 mg total) by mouth 2 (two) times daily.  . [DISCONTINUED] divalproex (DEPAKOTE ER) 250 MG 24 hr tablet Take 1 tablet (250 mg total) by mouth 2 (two) times daily.   No facility-administered encounter medications on file as of 08/25/2015.    No results found for this or any previous visit (from the past 2160 hour(s)).    Physical Exam: Constitutional:  BP 111/73 mmHg  Pulse 106  Ht 5\' 4"  (1.626 m)  Wt 188 lb 3.2 oz (85.367 kg)  BMI 32.29 kg/m2  Musculoskeletal: Strength & Muscle Tone: within normal limits Gait & Station: normal Patient leans: N/A  Mental Status Examination;  Patient is well groomed well dressed female who appears to be in her stated age.  She appears very emotional , tense and labile.  Her speech is pressured , fast and rambling.  She has flight of ideas .  Her part process is circumstantial.  She denies paranoia , auditory hallucination.  She denies any active or passive suicidal thoughts or homicidal thoughts.   Her attention and concentration is distracted.her fund of knowledge is average.  Her psychomotor activity is slightly increased.  She is alert and oriented x3.  Her  insight judgment and impulse control is fair.     Review of Psycho-Social Stressors (1), Review and summation of old records (2), Established Problem, Worsening (2), Review of Last Therapy Session (1), Review of Medication Regimen & Side Effects (2) and Review of New Medication or Change in Dosage (2)  Assessment: Axis I: Bipolar disorder with psychotic features, Anxiety disorder  Axis II:  deferred   Axis III:  Past Medical History  Diagnosis Date  . Depression   . Anxiety     Axis IV: Moderate    Plan:  Discuss in detail noncompliance with medication resulting in relapse into her illness.  Patient had a good response with Depakote.  Recommended to continue Depakote 250 mg twice a day.  She is getting Klonopin and Celexa from her primary care physician.  She is now taking Klonopin twice a day and Celexa 20 mg daily.  Encouraged to keep therapy with Victorino Dike for counseling.  We will consider Depakote level on her next appointment.  Recommended to call us back if she has any question or  any concern. Time spent 25 minutes.  More than 50% of the time spent in psychoeducation, counseling and coordination of care.  Discuss safety plan that anytime having active suicidal thoughts or homicidal thoughts then patient need to call 911 or go to the local emergency room.    Dallis Czaja T., MD 08/25/2015

## 2015-08-31 ENCOUNTER — Ambulatory Visit (HOSPITAL_COMMUNITY): Payer: Self-pay | Admitting: Psychiatry

## 2015-09-14 ENCOUNTER — Ambulatory Visit (HOSPITAL_COMMUNITY): Payer: Self-pay | Admitting: Psychiatry

## 2015-09-28 ENCOUNTER — Ambulatory Visit (INDEPENDENT_AMBULATORY_CARE_PROVIDER_SITE_OTHER): Payer: Medicare Other | Admitting: Psychiatry

## 2015-09-28 DIAGNOSIS — F319 Bipolar disorder, unspecified: Secondary | ICD-10-CM

## 2015-09-30 NOTE — Progress Notes (Signed)
   THERAPIST PROGRESS NOTE  Session Time: 1:05-1:55  Participation Level: Active  Behavioral Response: CasualAlertEuthymic  Type of Therapy: Individual Therapy  Treatment Goals addressed: Anxiety  Interventions: Supportive  Summary: Gwendolyn Steele is a 65 y.o. female who presents with anxiety.   Suicidal/Homicidal: Nowithout intent/plan  Therapist Response: Pt. Continues to report significant improvement in mood. Pt. Reports that she has made peace with her sister who was the major trigger of her depression. Pt reports that she has been able to accept her sister because she is very sick and has been given about 2 years to live. Pt. Reports that she has engaged in self-care and recently scheduled appointments to get her hair and nails done. Pt. Reports that she is doing well on the new medication and that she is sleeping better. Pt. Reports that she has also been able to set healthier boundaries with family members and care-giving responsibilities and has been able to decline requests from family members without guilt.  Plan: Return again in 2-4weeks.  Diagnosis:Axis I: Anxiety Disorder NOS  Axis II: No diagnosis  Shaune PollackBrown, Ramzi Brathwaite B, [facility] 09/30/2015

## 2015-10-18 ENCOUNTER — Encounter (HOSPITAL_COMMUNITY): Payer: Self-pay | Admitting: Psychiatry

## 2015-10-18 ENCOUNTER — Ambulatory Visit (INDEPENDENT_AMBULATORY_CARE_PROVIDER_SITE_OTHER): Payer: Medicare Other | Admitting: Psychiatry

## 2015-10-18 VITALS — BP 116/78 | HR 61 | Ht 64.5 in | Wt 196.0 lb

## 2015-10-18 DIAGNOSIS — F319 Bipolar disorder, unspecified: Secondary | ICD-10-CM | POA: Diagnosis not present

## 2015-10-18 DIAGNOSIS — F419 Anxiety disorder, unspecified: Secondary | ICD-10-CM | POA: Diagnosis not present

## 2015-10-18 DIAGNOSIS — Z79899 Other long term (current) drug therapy: Secondary | ICD-10-CM

## 2015-10-18 MED ORDER — DIVALPROEX SODIUM ER 250 MG PO TB24: ORAL_TABLET | ORAL | Status: AC

## 2015-10-18 NOTE — Progress Notes (Signed)
[facility] [id number] Progress Note  Gwendolyn Steele [id number] 65 y.o.  10/18/2015 10:25 AM  Chief Complaint:  I am taking Depakote but I still feel irritable and angry some time.  I cannot sleep all night.  I started counseling and I like to continue therapy.    History of Present Illness:  Gwendolyn Steele came for her follow-up appointment.  Now she is taking Depakote 250 mg twice a day.  She has noticed improvement in her mood but she still have irritability, anger, mood swing, racing thoughts and residual manic symptoms.  She is concerned about weight gain.  She feels proud that she stop smoking 3 weeks ago but also endorses increased appetite and not doing regular exercise.  She still grief about losing her family member who died in 04-Jul-2023 when she was in [place].  She is concerned about her sister who has kidney and liver failure and now on dialysis.  She started seeing Boneta Lucks and she feels she connect with the therapist and like to continue counseling in the future.  She has poor sleep and she admitted some time having racing thoughts, flight of ideas and getting easily distracted.  She wanted to try a higher dose of Depakote.  She has no tremors, shakes, EPS or any other concerns or side effects other than that she gained weight.  She denies any hallucination, paranoia or any obsessive thoughts.  She is taking Celexa 20 mg daily and Klonopin twice a day which is prescribed by her primary care physician.  She has not seen her primary care physician since October.  Her physician is in [place].  She is thinking to switch her primary care physician in [place].  She is also pleased that she stop smoking .  She denies any drinking or using any illegal substances.  She still make a trip to [place] frequently to see her family member.  Patient denies any depressive thoughts or any crying spells or any feeling of hopelessness or worthlessness.  She is open to try a higher  dose of Depakote.  Suicidal Ideation: No Plan Formed: No Patient has means to carry out plan: No  Homicidal Ideation: No Plan Formed: No Patient has means to carry out plan: No  Past Psychiatric History/Hospitalization(s) Patient has history of psychiatric illness since 46 when she had broken up with a boyfriend.  She was hospitalized for 30 days in [place].  She has at least 3 inpatient psychiatric treatment and at least 3 suicidal attempt.  She remember taking the overdose on Tylenol which requires ventilator and ICU treatment.  Patient endorses history of highs and lows, delusions, grandiosity, mania, hallucination, severe depression, irritability, impulsive behavior and paranoid thinking.  She had tried Abilify, Seroquel and Risperdal.  She remember after taking Abilify she got more suicidal.  She gained weight from Seroquel.  She was seeing psychiatrist in [place] until she was fired from his office and then start getting Klonopin and Celexa from primary care physician.   Anxiety: Yes Bipolar Disorder: Yes Depression: Yes Mania: Yes Psychosis: Yes Schizophrenia: No Personality Disorder: No Hospitalization for psychiatric illness: Yes History of Electroconvulsive Shock Therapy: No Prior Suicide Attempts: Yes  Medical History;  patient has no active medical problems.  Her primary care physician is in [place].  Family History; Patient denies any family history of psychiatric illness.    Psychosocial History; Patient lives in [place] most of her life.  She divorced twice.  She has one son who lives in  [place].  However she does not get along very well with her son's wife.  She has close family in [place].  She's been visiting every year [place] and usually she stays 4 month in [place].  She lives by herself but recently her niece living with her.  Substance Abuse History; Patient endorsed history of heavy drinking in the past but claims to  be sober in recent years.  She denies any illegal substance use.     Review of Systems: Psychiatric: Agitation: Irritability Hallucination: No Depressed Mood: No Insomnia: Yes Hypersomnia: No Altered Concentration: No Feels Worthless: No Grandiose Ideas: No Belief In Special Powers: No New/Increased Substance Abuse: No Compulsions: No  Neurologic: Headache: No Seizure: No Paresthesias: No    Outpatient Encounter Prescriptions as of 10/18/2015  Medication Sig  . citalopram (CELEXA) 20 MG tablet Take 1 tablet (20 mg total) by mouth daily.  . clonazePAM (KLONOPIN) 1 MG tablet Take 1 tablet (1 mg total) by mouth 2 (two) times daily as needed for anxiety.  . divalproex (DEPAKOTE ER) 250 MG 24 hr tablet Take 1 tab in am and 2 at bed time  . [DISCONTINUED] divalproex (DEPAKOTE ER) 250 MG 24 hr tablet Take 1 tablet (250 mg total) by mouth 2 (two) times daily.   No facility-administered encounter medications on file as of 10/18/2015.    No results found for this or any previous visit (from the past 2160 hour(s)).    Physical Exam: Constitutional:  BP 116/78 mmHg  Pulse 61  Ht 5' 4.5" (1.638 m)  Wt 196 lb (88.905 kg)  BMI 33.14 kg/m2  Musculoskeletal: Strength & Muscle Tone: within normal limits Gait & Station: normal Patient leans: N/A  Psychiatric Specialty Exam: Physical Exam  ROS  Blood pressure 116/78, pulse 61, height 5' 4.5" (1.638 m), weight 196 lb (88.905 kg).Body mass index is 33.14 kg/(m^2).  General Appearance: Casual  Eye Contact::  Fair  Speech:  Fast  Volume:  Normal  Mood:  Anxious and Irritable  Affect:  Labile  Thought Process:  Circumstantial  Orientation:  Full (Time, Place, and Person)  Thought Content:  Rumination  Suicidal Thoughts:  No  Homicidal Thoughts:  No  Memory:  Immediate;   Fair Recent;   Fair Remote;   Fair  Judgement:  Fair  Insight:  Fair  Psychomotor Activity:  Increased  Concentration:  Fair  Recall:  Fair  Fund of  Knowledge:  Good  Language:  Good  Akathisia:  No  Handed:  Right  AIMS (if indicated):     Assets:  Communication Skills Desire for Improvement Financial Resources/Insurance Housing Social Support  ADL's:  Intact  Cognition:  WNL  Sleep:       Review of Psycho-Social Stressors (1), Review or order clinical lab tests (1), Review and summation of old records (2), Established Problem, Worsening (2), Review of Last Therapy Session (1), Review of Medication Regimen & Side Effects (2) and Review of New Medication or Change in Dosage (2)  Assessment: Axis I: Bipolar disorder with psychotic features, Anxiety disorder  Axis II:  deferred   Axis III:  Past Medical History  Diagnosis Date  . Depression   . Anxiety     Axis IV: Moderate    Plan:  Patient shown some improvement since taking Depakote but she still have residual mood lability.  We discussed medication side effects patient weight gain however she also stop smoking and her weight gain could be due to that reason.  I strongly encouraged to watch her calorie intake and to regular exercise.  I recommended to increase Depakote and she will take 250 mg in the morning and 500 mg at bedtime.  I would also do labs including CBC, CMP, hemoglobin A1c, TSH and Depakote level.  We also discuss if she can try [place] which she has never tried before however she will check with her insurance company if [employer] covers.  She is getting Klonopin and Celexa from her primary care physician.  Encouraged to keep appointment with Boneta Lucks. Recommended to call us back if she has any question or any concern.  Follow-up in 4 weeks.  Time spent 25 minutes.  More than 50% of the time spent in psychoeducation, counseling and coordination of care.  Discuss safety plan that anytime having active suicidal thoughts or homicidal thoughts then patient need to call 911 or go to the local emergency room.    Marishka Rentfrow T., MD 10/18/2015

## 2015-10-25 ENCOUNTER — Other Ambulatory Visit (HOSPITAL_COMMUNITY): Payer: Self-pay | Admitting: Psychiatry

## 2015-10-26 LAB — COMPREHENSIVE METABOLIC PANEL
ALBUMIN: 4.3 g/dL (ref 3.6–4.8)
ALK PHOS: 89 IU/L (ref 39–117)
ALT: 77 IU/L — ABNORMAL HIGH (ref 0–32)
AST: 66 IU/L — AB (ref 0–40)
Albumin/Globulin Ratio: 1.9 (ref 1.2–2.2)
BUN/Creatinine Ratio: 19 (ref 12–28)
BUN: 15 mg/dL (ref 8–27)
Bilirubin Total: 0.4 mg/dL (ref 0.0–1.2)
CHLORIDE: 100 mmol/L (ref 96–106)
CO2: 22 mmol/L (ref 18–29)
Calcium: 9 mg/dL (ref 8.7–10.3)
Creatinine, Ser: 0.8 mg/dL (ref 0.57–1.00)
GFR calc Af Amer: 91 mL/min/{1.73_m2} (ref >59)
GFR calc non Af Amer: 79 mL/min/{1.73_m2} (ref >59)
GLOBULIN, TOTAL: 2.3 g/dL (ref 1.5–4.5)
Glucose: 89 mg/dL (ref 65–99)
Potassium: 4.9 mmol/L (ref 3.5–5.2)
SODIUM: 141 mmol/L (ref 134–144)
Total Protein: 6.6 g/dL (ref 6.0–8.5)

## 2015-10-26 LAB — CBC WITH DIFFERENTIAL/PLATELET
BASOS ABS: 0 10*3/uL (ref 0.0–0.2)
Basos: 1 %
EOS (ABSOLUTE): 0.1 10*3/uL (ref 0.0–0.4)
EOS: 1 %
HEMATOCRIT: 41.1 % (ref 34.0–46.6)
HEMOGLOBIN: 13.7 g/dL (ref 11.1–15.9)
Immature Grans (Abs): 0 10*3/uL (ref 0.0–0.1)
Immature Granulocytes: 0 %
LYMPHS ABS: 1.6 10*3/uL (ref 0.7–3.1)
Lymphs: 24 %
MCH: 30 pg (ref 26.6–33.0)
MCHC: 33.3 g/dL (ref 31.5–35.7)
MCV: 90 fL (ref 79–97)
MONOCYTES: 8 %
Monocytes Absolute: 0.5 10*3/uL (ref 0.1–0.9)
NEUTROS ABS: 4.4 10*3/uL (ref 1.4–7.0)
Neutrophils: 66 %
Platelets: 255 10*3/uL (ref 150–379)
RBC: 4.56 x10E6/uL (ref 3.77–5.28)
RDW: 14.8 % (ref 12.3–15.4)
WBC: 6.6 10*3/uL (ref 3.4–10.8)

## 2015-10-26 LAB — TSH: TSH: 1.6 u[IU]/mL (ref 0.450–4.500)

## 2015-10-26 LAB — VALPROIC ACID LEVEL: Valproic Acid Lvl: 35 ug/mL — ABNORMAL LOW (ref 50–100)

## 2015-11-10 ENCOUNTER — Ambulatory Visit (HOSPITAL_COMMUNITY): Payer: Self-pay | Admitting: Psychiatry

## 2015-11-19 ENCOUNTER — Ambulatory Visit (HOSPITAL_COMMUNITY): Payer: Self-pay | Admitting: Psychiatry

## 2015-11-24 ENCOUNTER — Ambulatory Visit (HOSPITAL_COMMUNITY): Payer: Self-pay | Admitting: Psychiatry

## 2016-10-10 ENCOUNTER — Other Ambulatory Visit: Payer: Self-pay | Admitting: Physician Assistant

## 2016-10-10 DIAGNOSIS — M7541 Impingement syndrome of right shoulder: Secondary | ICD-10-CM

## 2016-10-12 ENCOUNTER — Other Ambulatory Visit: Payer: Self-pay | Admitting: Physician Assistant

## 2016-10-12 DIAGNOSIS — M25511 Pain in right shoulder: Secondary | ICD-10-CM

## 2016-10-17 ENCOUNTER — Other Ambulatory Visit: Payer: Self-pay | Admitting: Physician Assistant

## 2016-10-17 DIAGNOSIS — M25511 Pain in right shoulder: Secondary | ICD-10-CM

## 2016-10-24 ENCOUNTER — Ambulatory Visit: Payer: PRIVATE HEALTH INSURANCE

## 2016-11-03 ENCOUNTER — Ambulatory Visit
Admission: RE | Admit: 2016-11-03 | Discharge: 2016-11-03 | Disposition: A | Discharge status: Home / Self Care | Payer: Medicare Other | Source: Ambulatory Visit | Attending: Physician Assistant | Admitting: Physician Assistant

## 2016-11-03 DIAGNOSIS — M19011 Primary osteoarthritis, right shoulder: Secondary | ICD-10-CM | POA: Insufficient documentation

## 2016-11-03 DIAGNOSIS — M25511 Pain in right shoulder: Secondary | ICD-10-CM | POA: Diagnosis not present

## 2016-11-03 MED ORDER — LIDOCAINE HCL (PF) 1 % IJ SOLN
15.0000 mL | Freq: Once | INTRAMUSCULAR | Status: AC
  Administered 2016-11-03: 15 mL
  Filled 2016-11-03: qty 15

## 2016-11-03 MED ORDER — IOPAMIDOL (ISOVUE-200) INJECTION 41%
10.0000 mL | Freq: Once | INTRAVENOUS | Status: AC
  Administered 2016-11-03: 10 mL via INTRA_ARTICULAR
  Filled 2016-11-03: qty 1

## 2020-03-04 ENCOUNTER — Other Ambulatory Visit: Payer: Self-pay

## 2020-03-04 ENCOUNTER — Emergency Department
Admission: EM | Admit: 2020-03-04 | Discharge: 2020-03-04 | Disposition: A | Discharge status: Home / Self Care | Payer: Medicare Other | Attending: Emergency Medicine | Admitting: Emergency Medicine

## 2020-03-04 DIAGNOSIS — R55 Syncope and collapse: Secondary | ICD-10-CM | POA: Insufficient documentation

## 2020-03-04 DIAGNOSIS — R531 Weakness: Secondary | ICD-10-CM | POA: Insufficient documentation

## 2020-03-04 DIAGNOSIS — Z5321 Procedure and treatment not carried out due to patient leaving prior to being seen by health care provider: Secondary | ICD-10-CM | POA: Insufficient documentation

## 2020-03-04 LAB — BASIC METABOLIC PANEL
Anion gap: 10 (ref 5–15)
BUN: 21 mg/dL (ref 8–23)
CO2: 28 mmol/L (ref 22–32)
Calcium: 9.3 mg/dL (ref 8.9–10.3)
Chloride: 101 mmol/L (ref 98–111)
Creatinine, Ser: 0.82 mg/dL (ref 0.44–1.00)
GFR calc Af Amer: >60 mL/min (ref >60)
GFR calc non Af Amer: >60 mL/min (ref >60)
Glucose, Bld: 92 mg/dL (ref 70–99)
Potassium: 4.2 mmol/L (ref 3.5–5.1)
Sodium: 139 mmol/L (ref 135–145)

## 2020-03-04 LAB — CBC
HCT: 43.4 % (ref 36.0–46.0)
Hemoglobin: 14.3 g/dL (ref 12.0–15.0)
MCH: 30.2 pg (ref 26.0–34.0)
MCHC: 32.9 g/dL (ref 30.0–36.0)
MCV: 91.6 fL (ref 80.0–100.0)
Platelets: 243 10*3/uL (ref 150–400)
RBC: 4.74 MIL/uL (ref 3.87–5.11)
RDW: 13.4 % (ref 11.5–15.5)
WBC: 8.2 10*3/uL (ref 4.0–10.5)
nRBC: 0 % (ref 0.0–0.2)

## 2020-03-04 NOTE — ED Triage Notes (Signed)
Patient c/o syncopal episode today. Patient c/o nausea X 2 week. Patient reports recent UTI, last dose of antibiotics 1 week ago.

## 2020-03-04 NOTE — ED Triage Notes (Addendum)
C/O general weakness x 3 weeks.  Recent UTI, which has been treated and resolved.  Patient had a syncopal episode today when standing from sitting.  20 g L hand.  4 mg Zofran given PTA.  Arrives AAOx3.  Skin warm and dry.  NAD.  VS wnl.

## 2021-03-06 ENCOUNTER — Encounter (HOSPITAL_COMMUNITY): Payer: Self-pay | Admitting: *Deleted

## 2021-03-06 ENCOUNTER — Emergency Department (HOSPITAL_COMMUNITY): Payer: Medicare Other

## 2021-03-06 ENCOUNTER — Other Ambulatory Visit: Payer: Self-pay

## 2021-03-06 ENCOUNTER — Emergency Department (HOSPITAL_COMMUNITY)
Admission: EM | Admit: 2021-03-06 | Discharge: 2021-03-07 | Disposition: A | Discharge status: Home / Self Care | Payer: Medicare Other | Attending: Emergency Medicine | Admitting: Emergency Medicine

## 2021-03-06 DIAGNOSIS — Y929 Unspecified place or not applicable: Secondary | ICD-10-CM | POA: Insufficient documentation

## 2021-03-06 DIAGNOSIS — Y999 Unspecified external cause status: Secondary | ICD-10-CM | POA: Diagnosis not present

## 2021-03-06 DIAGNOSIS — R0789 Other chest pain: Secondary | ICD-10-CM | POA: Diagnosis not present

## 2021-03-06 DIAGNOSIS — Y939 Activity, unspecified: Secondary | ICD-10-CM | POA: Insufficient documentation

## 2021-03-06 DIAGNOSIS — J449 Chronic obstructive pulmonary disease, unspecified: Secondary | ICD-10-CM | POA: Diagnosis not present

## 2021-03-06 DIAGNOSIS — Z20822 Contact with and (suspected) exposure to covid-19: Secondary | ICD-10-CM | POA: Diagnosis not present

## 2021-03-06 DIAGNOSIS — Z87891 Personal history of nicotine dependence: Secondary | ICD-10-CM | POA: Diagnosis not present

## 2021-03-06 DIAGNOSIS — Z79899 Other long term (current) drug therapy: Secondary | ICD-10-CM | POA: Insufficient documentation

## 2021-03-06 DIAGNOSIS — R519 Headache, unspecified: Secondary | ICD-10-CM | POA: Insufficient documentation

## 2021-03-06 DIAGNOSIS — M542 Cervicalgia: Secondary | ICD-10-CM | POA: Diagnosis not present

## 2021-03-06 DIAGNOSIS — S3991XA Unspecified injury of abdomen, initial encounter: Secondary | ICD-10-CM | POA: Diagnosis not present

## 2021-03-06 DIAGNOSIS — S2232XA Fracture of one rib, left side, initial encounter for closed fracture: Secondary | ICD-10-CM

## 2021-03-06 HISTORY — DX: Chronic obstructive pulmonary disease, unspecified: J44.9

## 2021-03-06 MED ORDER — ACETAMINOPHEN 500 MG PO TABS: 1000.0000 mg | ORAL_TABLET | Freq: Once | ORAL | Status: DC

## 2021-03-06 MED ORDER — MORPHINE SULFATE (PF) 4 MG/ML IV SOLN
4.0000 mg | Freq: Once | INTRAVENOUS | Status: AC
  Administered 2021-03-06: 4 mg via INTRAVENOUS
  Filled 2021-03-06: qty 1

## 2021-03-06 MED ORDER — OXYCODONE HCL 5 MG PO TABS
5.0000 mg | ORAL_TABLET | Freq: Once | ORAL | Status: AC
  Administered 2021-03-07: 5 mg via ORAL
  Filled 2021-03-06: qty 1

## 2021-03-06 MED ORDER — IBUPROFEN 400 MG PO TABS: 400.0000 mg | ORAL_TABLET | Freq: Once | ORAL | Status: DC

## 2021-03-06 NOTE — ED Notes (Signed)
To x-ray
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
## 2021-03-06 NOTE — ED Notes (Signed)
The pt is alert and oriented x 4  Family at the bedside
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
## 2021-03-07 ENCOUNTER — Emergency Department (HOSPITAL_COMMUNITY): Payer: Medicare Other

## 2021-03-07 DIAGNOSIS — R0789 Other chest pain: Secondary | ICD-10-CM | POA: Diagnosis not present

## 2021-03-07 LAB — CBC
HCT: 40.5 % (ref 36.0–46.0)
Hemoglobin: 13.6 g/dL (ref 12.0–15.0)
MCH: 30.6 pg (ref 26.0–34.0)
MCHC: 33.6 g/dL (ref 30.0–36.0)
MCV: 91 fL (ref 80.0–100.0)
Platelets: 241 10*3/uL (ref 150–400)
RBC: 4.45 MIL/uL (ref 3.87–5.11)
RDW: 14 % (ref 11.5–15.5)
WBC: 16.6 10*3/uL — ABNORMAL HIGH (ref 4.0–10.5)
nRBC: 0 % (ref 0.0–0.2)

## 2021-03-07 LAB — LACTIC ACID, PLASMA: Lactic Acid, Venous: 1.1 mmol/L (ref 0.5–1.9)

## 2021-03-07 LAB — RESP PANEL BY RT-PCR (FLU A&B, COVID) ARPGX2
Influenza A by PCR: NEGATIVE
Influenza B by PCR: NEGATIVE
SARS Coronavirus 2 by RT PCR: NEGATIVE

## 2021-03-07 LAB — COMPREHENSIVE METABOLIC PANEL
ALT: 22 U/L (ref 0–44)
AST: 49 U/L — ABNORMAL HIGH (ref 15–41)
Albumin: 3.4 g/dL — ABNORMAL LOW (ref 3.5–5.0)
Alkaline Phosphatase: 69 U/L (ref 38–126)
Anion gap: 7 (ref 5–15)
BUN: 11 mg/dL (ref 8–23)
CO2: 25 mmol/L (ref 22–32)
Calcium: 9.3 mg/dL (ref 8.9–10.3)
Chloride: 108 mmol/L (ref 98–111)
Creatinine, Ser: 0.98 mg/dL (ref 0.44–1.00)
GFR, Estimated: >60 mL/min (ref >60)
Glucose, Bld: 87 mg/dL (ref 70–99)
Potassium: 3.8 mmol/L (ref 3.5–5.1)
Sodium: 140 mmol/L (ref 135–145)
Total Bilirubin: 0.8 mg/dL (ref 0.3–1.2)
Total Protein: 6 g/dL — ABNORMAL LOW (ref 6.5–8.1)

## 2021-03-07 LAB — PROTIME-INR
INR: 1 (ref 0.8–1.2)
Prothrombin Time: 12.9 seconds (ref 11.4–15.2)

## 2021-03-07 LAB — ETHANOL: Alcohol, Ethyl (B): <10 mg/dL (ref <10)

## 2021-03-07 MED ORDER — OXYCODONE HCL 5 MG PO TABS: 2.5000 mg | ORAL_TABLET | Freq: Four times a day (QID) | ORAL | 0 refills | Status: AC | PRN

## 2021-03-07 MED ORDER — IOHEXOL 350 MG/ML SOLN
75.0000 mL | Freq: Once | INTRAVENOUS | Status: AC | PRN
  Administered 2021-03-07: 75 mL via INTRAVENOUS

## 2021-03-07 MED ORDER — HYDROCODONE-ACETAMINOPHEN 5-325 MG PO TABS: 2.0000 | ORAL_TABLET | ORAL | 0 refills | Status: DC | PRN

## 2021-03-07 NOTE — Discharge Instructions (Addendum)
Incentive spirometer: Use frequently to prevent pneumonia.  Recommended usage: 10 deep inhalations through spirometer every 2-3 hours for 7-10 days.  For pain control you may take at 1000 mg of Tylenol every 8 hours scheduled.  In addition you can take 0.5 to 1 tablet of Oxycodone every 6 hours for pain not controlled with the scheduled Tylenol.

## 2021-03-07 NOTE — ED Notes (Signed)
Pt back to ct
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
## 2021-03-07 NOTE — Progress Notes (Signed)
Orthopedic Tech Progress Note Patient Details:  Gwendolyn Steele 08/05/1950 [id number]  Ortho Devices Type of Ortho Device: Sling immobilizer Ortho Device/Splint Location: lue Ortho Device/Splint Interventions: Ordered, Application, Adjustment   Post Interventions Patient Tolerated: Well Instructions Provided: Care of device, Adjustment of device  [name] 03/07/2021, 4:52 AM

## 2021-03-11 ENCOUNTER — Ambulatory Visit: Payer: Self-pay
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
